# Patient Record
Sex: Female | Born: 1970 | Race: White | Hispanic: No | Marital: Married | Smoking: Never smoker
Health system: Southern US, Community
[De-identification: ages and names within clinical notes are randomized; demographics above are authoritative.]

## PROBLEM LIST (undated history)

## (undated) DIAGNOSIS — H8110 Benign paroxysmal vertigo, unspecified ear: Secondary | ICD-10-CM

## (undated) DIAGNOSIS — G2581 Restless legs syndrome: Secondary | ICD-10-CM

## (undated) DIAGNOSIS — E042 Nontoxic multinodular goiter: Secondary | ICD-10-CM

## (undated) HISTORY — DX: Benign paroxysmal vertigo, unspecified ear: H81.10

## (undated) HISTORY — DX: Nontoxic multinodular goiter: E04.2

## (undated) HISTORY — DX: Hemochromatosis, unspecified: E83.119

## (undated) HISTORY — DX: Restless legs syndrome: G25.81

## (undated) HISTORY — PX: TUBAL LIGATION: SHX77

---

## 2003-11-18 HISTORY — PX: INGUINAL HERNIA REPAIR: SUR1180

## 2013-07-29 ENCOUNTER — Ambulatory Visit (INDEPENDENT_AMBULATORY_CARE_PROVIDER_SITE_OTHER): Payer: BC Managed Care – PPO | Admitting: Internal Medicine

## 2013-07-29 ENCOUNTER — Other Ambulatory Visit: Payer: Self-pay | Admitting: Internal Medicine

## 2013-07-29 ENCOUNTER — Encounter: Payer: Self-pay | Admitting: Internal Medicine

## 2013-07-29 VITALS — BP 118/80 | HR 69 | Temp 98.7°F | Resp 10 | Ht 71.0 in | Wt 185.0 lb

## 2013-07-29 DIAGNOSIS — R946 Abnormal results of thyroid function studies: Secondary | ICD-10-CM

## 2013-07-29 DIAGNOSIS — E049 Nontoxic goiter, unspecified: Secondary | ICD-10-CM

## 2013-07-29 DIAGNOSIS — E161 Other hypoglycemia: Secondary | ICD-10-CM

## 2013-07-29 DIAGNOSIS — E162 Hypoglycemia, unspecified: Secondary | ICD-10-CM

## 2013-07-29 DIAGNOSIS — E041 Nontoxic single thyroid nodule: Secondary | ICD-10-CM

## 2013-07-29 LAB — T4, FREE: Free T4: 0.9 ng/dL (ref 0.60–1.60)

## 2013-07-29 LAB — COMPREHENSIVE METABOLIC PANEL
Alkaline Phosphatase: 41 U/L (ref 39–117)
Creatinine, Ser: 0.7 mg/dL (ref 0.4–1.2)
Glucose, Bld: 95 mg/dL (ref 70–99)
Sodium: 139 mEq/L (ref 135–145)
Total Bilirubin: 0.7 mg/dL (ref 0.3–1.2)
Total Protein: 6.9 g/dL (ref 6.0–8.3)

## 2013-07-29 LAB — GLUCOSE, POCT (MANUAL RESULT ENTRY): POC Glucose: 105 mg/dl — AB (ref 70–99)

## 2013-07-29 LAB — T3, FREE: T3, Free: 2.9 pg/mL (ref 2.3–4.2)

## 2013-07-29 LAB — HEMOGLOBIN A1C: Hgb A1c MFr Bld: 5.4 % (ref 4.6–6.5)

## 2013-07-29 NOTE — Progress Notes (Addendum)
Subjective:     Patient ID: Felicia Campos, female   DOB: [birth year], 42 y.o.   MRN: [id number]  HPI [name] is a pleasant 42 y/o woman, self referred for evaluation for hypoglycemic symptoms without the diagnosis of diabetes or confirmed hypoglycemia. I have no records in our system that I could review.   She has sxs of hypoglycemia since few year ago, worse in the last year. - sweating - tremors - memory loss - has to eat a lot when this happens >> takes 3h to feel better - weight gain 20 lbs in 1 year - no HAs - sugars make her feel better - sxs worse in am, mostly after eating - coffee makes it worse - no change with 1 glass red wine every day - no connection with exercise. She exercises by walking daily.  Meals: - Breakfast:eggs x 2, sausage, decaf coffee - Lunch: salads or cheeseburger - Dinner: salad or chicken, vegetables -low carbohydrate - Snacks: 2-3 a day: cheese, crackers, protein bars She tells me that she did feel better when she ate a low carb diet.  She did check blood sugars during one of these episodes: 110.  A year ago, she had borderline hypothyroidism >> not tx. A year later it was borderline hyperthyroidism, but she was told there is NTD. She did not follow with her PCP in [place].  No FH of DM, preDM, hypothyr. Mother has Raynaud's sd.  She does not take any medicines, no PMH other than anemia >> was told to take a MVI but she takes a B12 vitamin, instead.   Review of Systems Constitutional: see HPI Eyes: no blurry vision, no xerophthalmia ENT: no sore throat, no nodules palpated in throat, no dysphagia/odynophagia, no hoarseness Cardiovascular: no CP/SOB/palpitations/leg swelling Respiratory: no cough/SOB Gastrointestinal: no N/V/D/C Musculoskeletal: no muscle/joint aches Skin: no rashes Neurological: no tremors/numbness/tingling/dizziness Psychiatric: no depression/anxiety  PMH - anemia - h/o abnormal thyrodid tests  Past Surgical History   Procedure Laterality Date  . Tubal ligation    . Inguinal hernia repair  2005   History   Social History  . 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 Smoking status: Never Smoker   . Smokeless tobacco: Never Used  . Alcohol Use: 3.5 oz/week    7 drink(s) per week  . Drug Use: No  . Sexual Activity: Yes    Partners: Male   Social History Narrative   Regular exercise: walk; 20 min daily   Caffeine use: 1 soda daily   No current outpatient prescriptions on file prior to visit.   No current facility-administered medications on file prior to visit.   She only takes a B12 vit daily.   Family History  Problem Relation Age of Onset  . Stroke Mother     1  . Heart disease Maternal Grandmother   . Cancer Paternal Grandmother     breast cancer   Objective:   Physical Exam BP 118/80  Pulse 69  Temp(Src) 98.7 F (37.1 C) (Oral)  Resp 10  Ht 5\' 11"  (1.803 m)  Wt 185 lb (83.915 kg)  BMI 25.81 kg/m2  SpO2 98% Constitutional: normal weight, in NAD Eyes: PERRLA, EOMI, no exophthalmos ENT: moist mucous membranes, + thyromegaly - ? L sided nodule, no cervical lymphadenopathy Cardiovascular:  RRR, No MRG Respiratory: CTA B Gastrointestinal: abdomen soft, NT, ND, BS+ Musculoskeletal: no deformities, strength intact in all 4 Skin: moist, warm, no rashes Neurological: no tremor with outstretched hands, DTR normal in all 4  Assessment:     1. Hypoglycemia sxs without hypoglycemia  2. Abnormal thyroid tests in the past  3. Goiter    Plan:     1. Hypoglycemia sxs without hypoglycemia - patient has some symptoms and might be suggestive for hypoglycemia, however when she checked her sugars during these episodes, the sugar was normal - she is not taking medicines or supplements that can cause low blood sugars - CBG at the time of the  appointment 105  - we did discuss about possible causes for hypoglycemia, usually an insulin-glucose mismatch as in the case of insulin resistance/prediabetes, or the use of high glycemic index foods that can cause a "sugar crash" even in patients without insulin resistance. I gave her a list with fluids and there glycemic index, and advised her to avoid a high glycemic index foods. - I advised her to continue to check her sugars during these episodes - We'll check a hemoglobin A1c and CMP today - I would also add a vitamin D level  2. Abnormal thyroid tests in the past - I will repeat her TSH, free T4, free T3  3. Goiter - I feel that her thyroid is enlarged by palpation, and also she might have a left-sided thyroid nodule. I suggested that she has a thyroid ultrasound to make sure that the findings are not caused by neck fat tissue distribution. She agrees with this and I ordered the ultrasound.  I advised her to try my chart for easier communication.   Office Visit on 07/29/2013  Component Date Value Range Status  . Hemoglobin A1C 07/29/2013 5.4  4.6 - 6.5 % Final   Glycemic Control Guidelines for People with Diabetes:Non Diabetic:  <6%Goal of Therapy: <7%Additional Action Suggested:  >8%   . Sodium 07/29/2013 139  135 - 145 mEq/L Final  . Potassium 07/29/2013 4.1  3.5 - 5.1 mEq/L Final  . Chloride 07/29/2013 108  96 - 112 mEq/L Final  . CO2 07/29/2013 27  19 - 32 mEq/L Final  . Glucose, Bld 07/29/2013 95  70 - 99 mg/dL Final  . BUN 46/96/2952 15  6 - 23 mg/dL Final  . Creatinine, Ser 07/29/2013 0.7  0.4 - 1.2 mg/dL Final  . Total Bilirubin 07/29/2013 0.7  0.3 - 1.2 mg/dL Final  . Alkaline Phosphatase 07/29/2013 41  39 - 117 U/L Final  . AST 07/29/2013 11  0 - 37 U/L Final  . ALT 07/29/2013 11  0 - 35 U/L Final  . Total Protein 07/29/2013 6.9  6.0 - 8.3 g/dL Final  . Albumin 84/13/2440 4.2  3.5 - 5.2 g/dL Final  . Calcium 09/13/2535 9.0  8.4 - 10.5 mg/dL Final  . GFR 64/40/3474  93.56  >60.00 mL/min Final  . TSH 07/29/2013 0.65  0.35 - 5.50 uIU/mL Final  . Free T4 07/29/2013 0.90  0.60 - 1.60 ng/dL Final  . T3, Free 25/95/6387 2.9  2.3 - 4.2 pg/mL Final  . POC Glucose 07/29/2013 105* 70 - 99 mg/dl Final   done in office   Vit D level 65.  Msg sent; Dear Ms Izora Ribas, All labs are normal, no diabetes or prediabetes, no vitamin D deficiency, kidney and liver functions are normal, and all the thyroid tests are normal, as well. Please let me  know if you have any questions. Sincerely, Carlus Pavlov MD  Thyroid U/S: Ultrasound examination of the thyroid gland and adjacent soft tissues was performed. COMPARISON: None. FINDINGS: Right thyroid lobe Measurements: 4.3 x 1.7 x 2.1 cm. 1.3 cm complex predominantly cystic nodule in the midpole posteriorly. Other smaller scattered subcentimeric nodules. Left thyroid lobe Measurements: 5.0 x 2.1 x 2.1 cm. Inhomogeneous, isoechoic solid nodule in the mid to lower pole measuring 4.5 x 3.1 x 1.7 cm. Isthmus Thickness: 3 mm. No nodules visualized. Lymphadenopathy None visualized.  IMPRESSION: Bilateral thyroid nodules. The dominant finding is an isoechoic solid mass in the mid to lower pole of the left thyroid lobe. Findings meet consensus criteria for biopsy. Ultrasound-guided fine needle aspiration should be considered, as per the consensus statement: Management of Thyroid Nodules Detected at [place]: Society of Radiologists in Ultrasound Consensus Conference Statement. Radiology 2005; [id number].  Electronically Signed By: Charlett Nose M.D. On: 08/03/2013 10:47  Reviewing the images of the thyroid ultrasound above, the right thyroid 1.3 cm lesion is mostly cystic, and very low risk for cancer. However, the left isoechoic mass is very large and I would recommend biopsy. I will let patient know.  08/08/2013 (Monday): Pt was informed about the above results today, 3 working days after I received the results (I was in  vacation last week). She was very upset about the delay. She did want to go ahead with the Bx. Will order.  I wrote her a message apologizing for the delay. I explained that this is not an emergency, and several days will not influence the result/prognosis in any way.  08/18/2013: FNA result: Adequacy Reason Satisfactory For Evaluation. Diagnosis THYROID, FINE NEEDLE ASPIRATION, LEFT (SPECIMEN 1 OF 1, COLLECTED 08/16/13): BENIGN. FINDINGS CONSISTENT WITH NON-NEOPLASTIC GOITER. [place] RUND DO Pathologist, Electronic Signature  Sent pt msg about the benign results and the need for f/u in 1 year.

## 2013-07-29 NOTE — Patient Instructions (Addendum)
Please join MyChart. I will send you the results through there. You will be called with the thyroid ultrasound appointment. Continue to check sugars when you feel poorly >> let me know if they are <60. Please return in 6 months.  Consider the glycemic index of foods that you eat or blend (higher glycemic index = higher risk to increase your sugars): [email or web address]

## 2013-07-30 LAB — VITAMIN D 25 HYDROXY (VIT D DEFICIENCY, FRACTURES): Vit D, 25-Hydroxy: 65 ng/mL (ref 30–89)

## 2013-08-01 ENCOUNTER — Encounter: Payer: Self-pay | Admitting: Internal Medicine

## 2013-08-02 ENCOUNTER — Encounter: Payer: Self-pay | Admitting: Internal Medicine

## 2013-08-02 LAB — COMPREHENSIVE METABOLIC PANEL
ALT: 8 U/L (ref 0–35)
AST: 11 U/L (ref 0–37)
Alkaline Phosphatase: 45 U/L (ref 39–117)
Chloride: 105 mEq/L (ref 96–112)
Creat: 0.86 mg/dL (ref 0.50–1.10)
Total Bilirubin: 0.3 mg/dL (ref 0.3–1.2)

## 2013-08-03 ENCOUNTER — Ambulatory Visit (HOSPITAL_COMMUNITY)
Admission: RE | Admit: 2013-08-03 | Discharge: 2013-08-03 | Disposition: A | Discharge status: Home / Self Care | Payer: BC Managed Care – PPO | Source: Ambulatory Visit | Attending: Internal Medicine | Admitting: Internal Medicine

## 2013-08-03 ENCOUNTER — Encounter: Payer: Self-pay | Admitting: Internal Medicine

## 2013-08-03 DIAGNOSIS — E049 Nontoxic goiter, unspecified: Secondary | ICD-10-CM | POA: Insufficient documentation

## 2013-08-04 ENCOUNTER — Encounter: Payer: Self-pay | Admitting: Internal Medicine

## 2013-08-08 ENCOUNTER — Encounter: Payer: Self-pay | Admitting: Internal Medicine

## 2013-08-08 ENCOUNTER — Telehealth: Payer: Self-pay | Admitting: *Deleted

## 2013-08-08 NOTE — Telephone Encounter (Signed)
Pt called asking to set up an appt for a biopsy due to her U/S. Please advise.

## 2013-08-08 NOTE — Telephone Encounter (Signed)
I faxed most recent lab results and copy of her U/S to [facility] at [facility].

## 2013-08-08 NOTE — Telephone Encounter (Signed)
I will let her know I ordered it.

## 2013-08-16 ENCOUNTER — Ambulatory Visit
Admission: RE | Admit: 2013-08-16 | Discharge: 2013-08-16 | Disposition: A | Discharge status: Home / Self Care | Payer: BC Managed Care – PPO | Source: Ambulatory Visit | Attending: Internal Medicine | Admitting: Internal Medicine

## 2013-08-16 ENCOUNTER — Other Ambulatory Visit (HOSPITAL_COMMUNITY)
Admission: RE | Admit: 2013-08-16 | Discharge: 2013-08-16 | Disposition: A | Discharge status: Home / Self Care | Payer: BC Managed Care – PPO | Source: Ambulatory Visit | Attending: Interventional Radiology | Admitting: Interventional Radiology

## 2013-08-16 DIAGNOSIS — E041 Nontoxic single thyroid nodule: Secondary | ICD-10-CM

## 2013-09-22 ENCOUNTER — Other Ambulatory Visit: Payer: Self-pay

## 2015-08-03 IMAGING — US US SOFT TISSUE HEAD/NECK
1 series · 14 of 25 positions shown · non-contrast
Comparison: None.

CLINICAL DATA: Left thyroid nodule, goiter.

EXAM:
THYROID ULTRASOUND
TECHNIQUE: Ultrasound examination of the thyroid gland and adjacent soft
tissues was performed.

[Series 1: us soft tissue head/neck · 0.05mm/px · 14 of 43 slices shown]
[im 1/43]
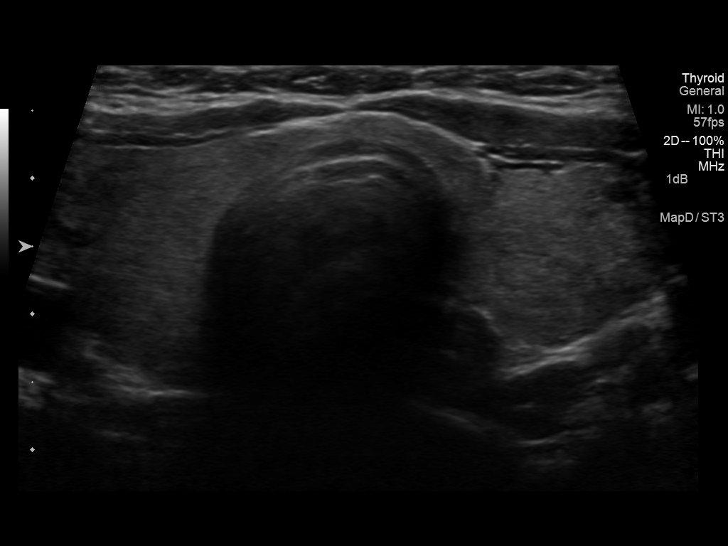
[im 4/43]
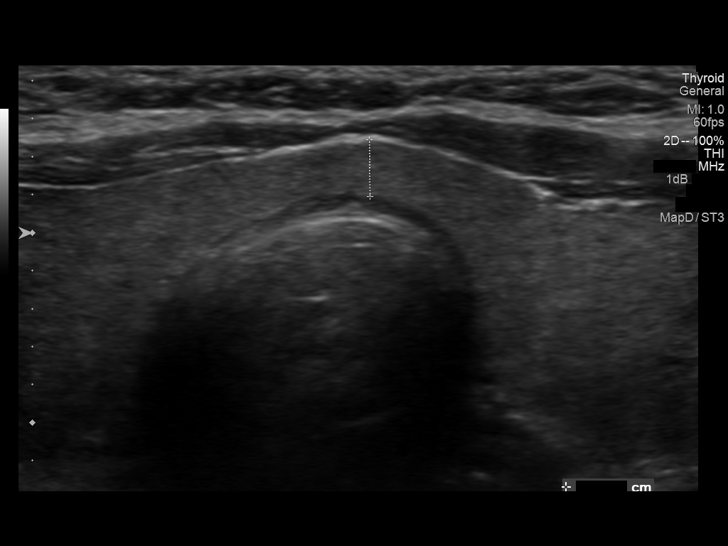
[im 8/43]
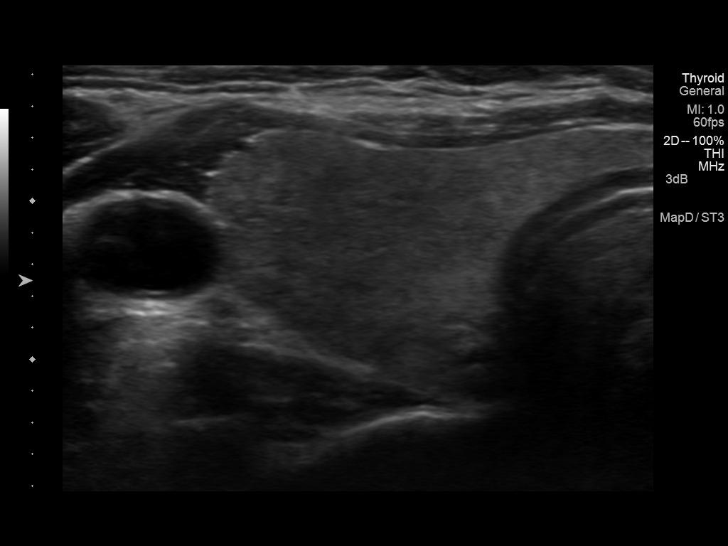
[im 11/43]
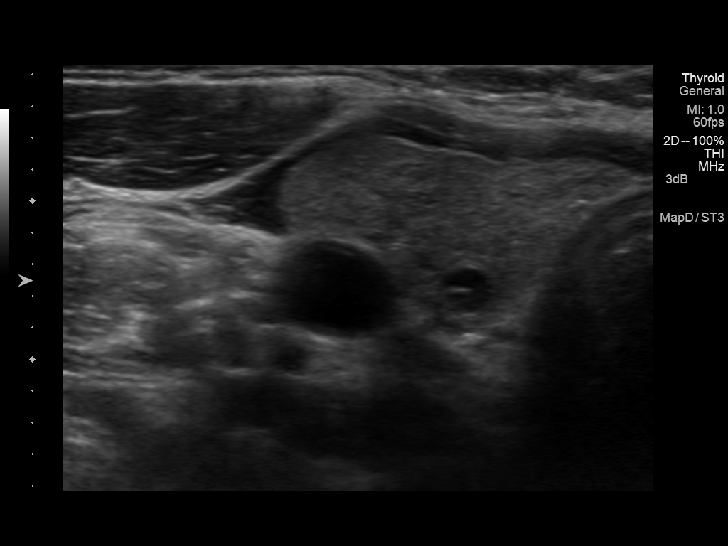
[im 15/43]
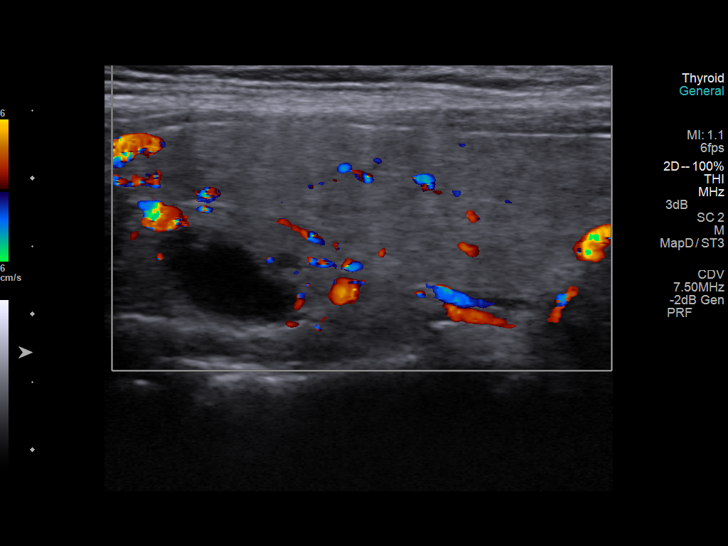
[im 16/43]
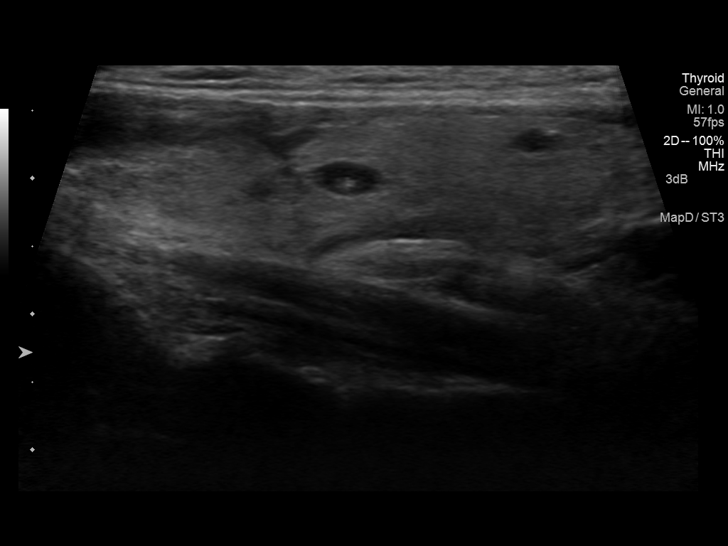
[im 20/43]
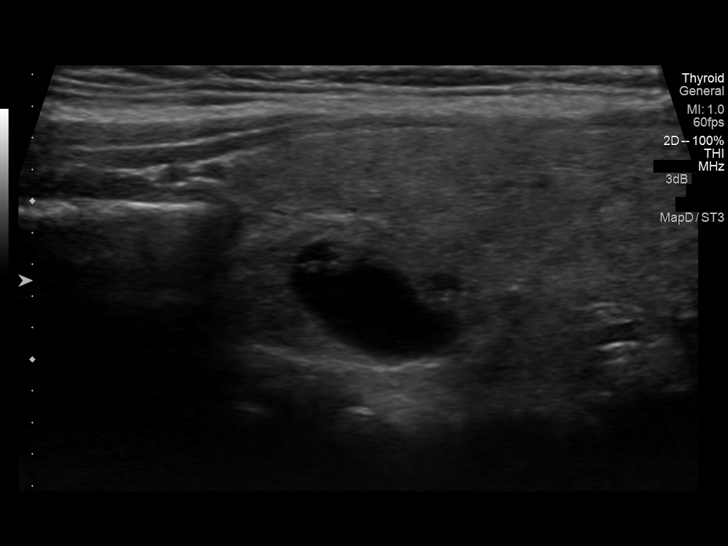
[im 23/43]
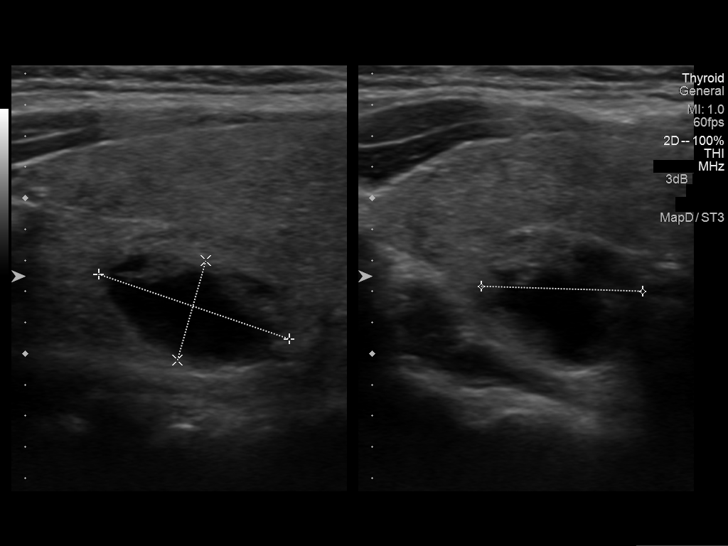
[im 27/43]
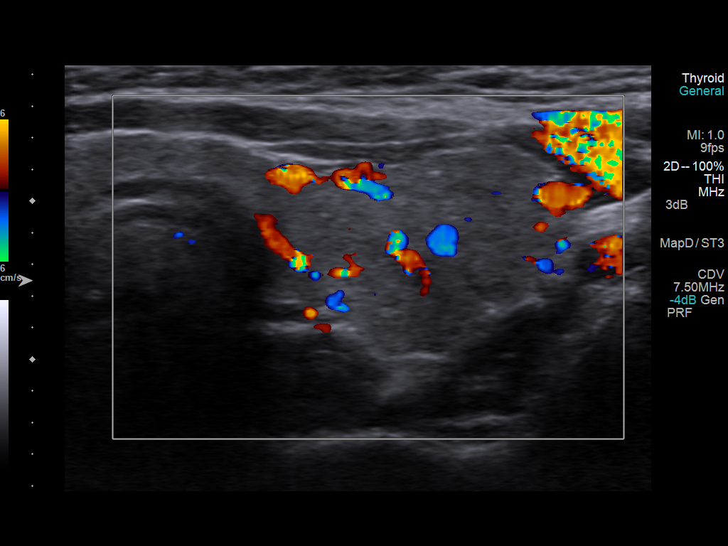
[im 29/43]
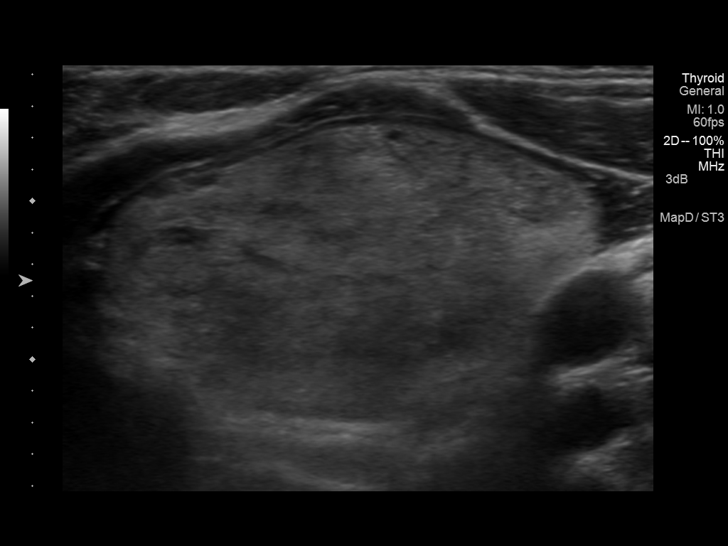
[im 32/43]
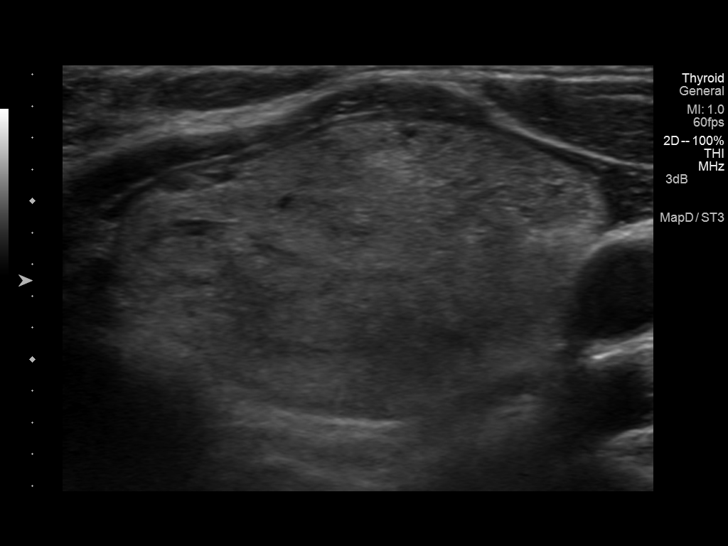
[im 36/43]
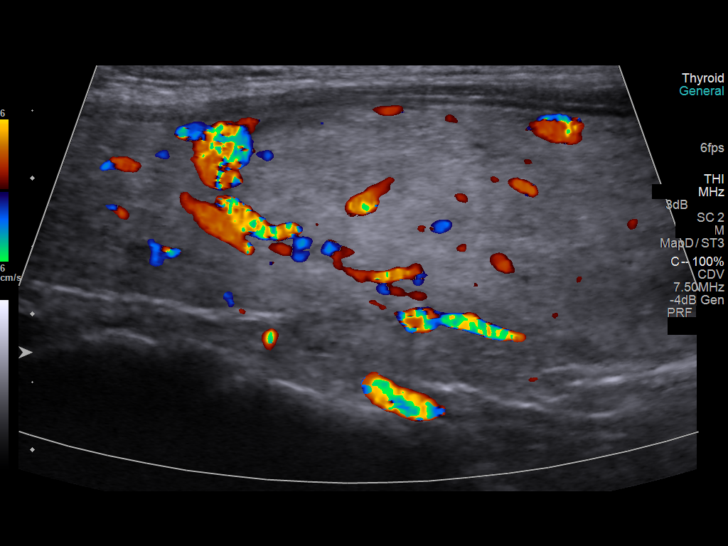
[im 39/43]
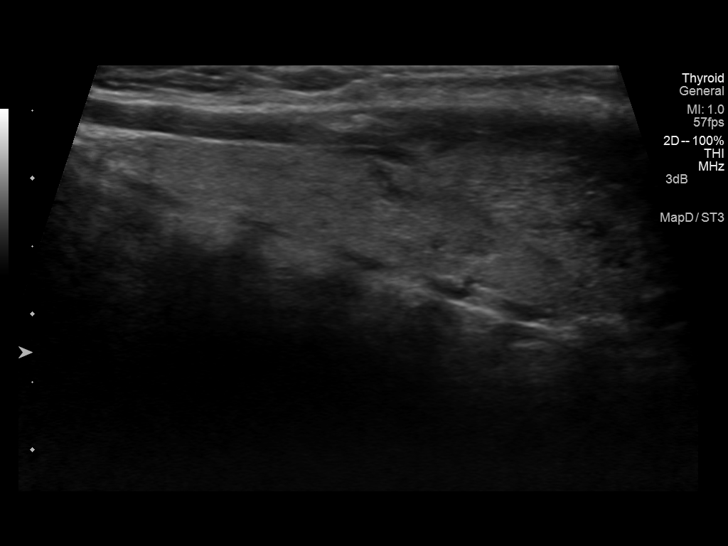
[im 43/43]
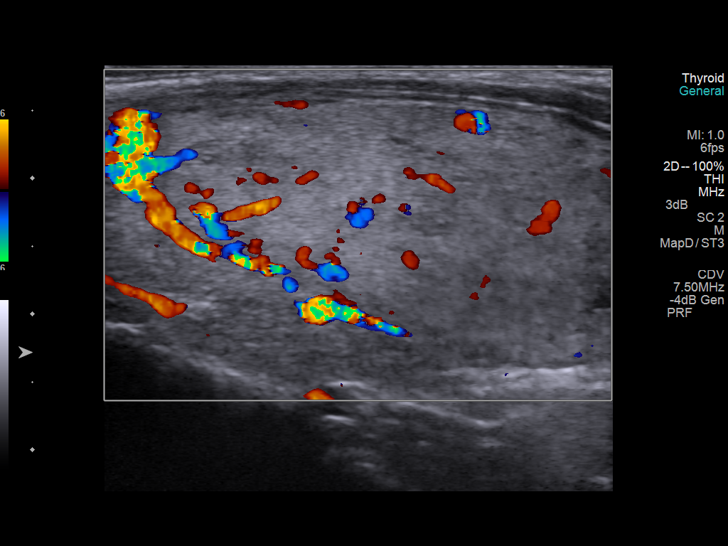

[14 of 25 positions shown; findings below may reference images not displayed]

FINDINGS: Right thyroid lobe

Measurements: 4.3 x 1.7 x 2.1 cm. 1.3 cm complex predominantly
cystic nodule in the midpole posteriorly. Other smaller scattered
subcentimeric nodules.

Left thyroid lobe

Measurements: 5.0 x 2.1 x 2.1 cm. Inhomogeneous, isoechoic solid
nodule in the mid to lower pole measuring 4.5 x 3.1 x 1.7 cm.

Isthmus

Thickness: 3 mm.  No nodules visualized.

Lymphadenopathy

None visualized.
IMPRESSION: Bilateral thyroid nodules. The dominant finding is an isoechoic
solid mass in the mid to lower pole of the left thyroid lobe.
Findings meet consensus criteria for biopsy. Ultrasound-guided fine
needle aspiration should be considered, as per the consensus
statement: Management of Thyroid Nodules Detected at US: Society of
Radiologists in Ultrasound Consensus Conference Statement. Radiology

## 2015-08-30 ENCOUNTER — Encounter: Payer: Self-pay | Admitting: Family

## 2015-08-30 ENCOUNTER — Ambulatory Visit (INDEPENDENT_AMBULATORY_CARE_PROVIDER_SITE_OTHER): Payer: PRIVATE HEALTH INSURANCE | Admitting: Family

## 2015-08-30 VITALS — BP 130/89 | HR 82 | Temp 97.4°F | Ht 71.0 in | Wt 165.2 lb

## 2015-08-30 DIAGNOSIS — B3731 Acute candidiasis of vulva and vagina: Secondary | ICD-10-CM

## 2015-08-30 DIAGNOSIS — N760 Acute vaginitis: Secondary | ICD-10-CM

## 2015-08-30 DIAGNOSIS — B373 Candidiasis of vulva and vagina: Secondary | ICD-10-CM | POA: Diagnosis not present

## 2015-08-30 DIAGNOSIS — A499 Bacterial infection, unspecified: Secondary | ICD-10-CM

## 2015-08-30 DIAGNOSIS — B9689 Other specified bacterial agents as the cause of diseases classified elsewhere: Secondary | ICD-10-CM

## 2015-08-30 LAB — POCT URINALYSIS DIPSTICK
BILIRUBIN UA: NEGATIVE
Blood, UA: NEGATIVE
GLUCOSE UA: NEGATIVE
Ketones, UA: NEGATIVE
LEUKOCYTES UA: NEGATIVE
NITRITE UA: NEGATIVE
PH UA: 7
PROTEIN UA: NEGATIVE
Spec Grav, UA: <=1.005
Urobilinogen, UA: NEGATIVE

## 2015-08-30 LAB — POCT WET PREP (WET MOUNT)

## 2015-08-30 LAB — POCT UA - MICROSCOPIC ONLY
Casts, Ur, LPF, POC: NEGATIVE
Crystals, Ur, HPF, POC: NEGATIVE
Mucus, UA: NEGATIVE
RBC, URINE, MICROSCOPIC: NEGATIVE
WBC, Ur, HPF, POC: NEGATIVE
Yeast, UA: NEGATIVE

## 2015-08-30 MED ORDER — METRONIDAZOLE 500 MG PO TABS: 500.0000 mg | ORAL_TABLET | Freq: Two times a day (BID) | ORAL | Status: DC

## 2015-08-30 NOTE — Patient Instructions (Signed)

## 2015-08-30 NOTE — Progress Notes (Signed)
   Subjective:    Patient ID: Felicia Campos, female    DOB: 1971/06/18, 44 y.o.   MRN: [id number]  Vaginal Itching The patient's primary symptoms include genital itching, a genital rash and vaginal discharge. The patient's pertinent negatives include no genital odor. This is a new problem. The current episode started in the past 7 days. The problem occurs constantly. The problem has been unchanged. The pain is mild. The problem affects both sides. Associated symptoms include painful intercourse. Pertinent negatives include no abdominal pain, chills, constipation, diarrhea, discolored urine, fever, flank pain, frequency, headaches, hematuria, nausea, rash, urgency or vomiting. The vaginal discharge was white. She has tried nothing for the symptoms. The treatment provided no relief.      Review of Systems  Constitutional: Negative.  Negative for fever and chills.  HENT: Negative.   Eyes: Negative.   Respiratory: Negative.  Negative for shortness of breath.   Cardiovascular: Negative.  Negative for palpitations.  Gastrointestinal: Negative.  Negative for nausea, vomiting, abdominal pain, diarrhea and constipation.  Endocrine: Negative.   Genitourinary: Positive for vaginal discharge. Negative for urgency, frequency, hematuria and flank pain.  Musculoskeletal: Negative.   Skin: Negative for rash.  Neurological: Negative.  Negative for headaches.  Hematological: Negative.   Psychiatric/Behavioral: Negative.   All other systems reviewed and are negative.      Objective:   Physical Exam  Constitutional: She is oriented to person, place, and time. She appears well-developed and well-nourished. No distress.  HENT:  Head: Normocephalic and atraumatic.  Right Ear: External ear normal.  Left Ear: External ear normal.  Nose: Nose normal.  Mouth/Throat: Oropharynx is clear and moist.  Eyes: Pupils are equal, round, and reactive to light.  Neck: Normal range of motion. Neck supple. No  thyromegaly present.  Cardiovascular: Normal rate, regular rhythm, normal heart sounds and intact distal pulses.   No murmur heard. Pulmonary/Chest: Effort normal and breath sounds normal. No respiratory distress. She has no wheezes.  Abdominal: Soft. Bowel sounds are normal. She exhibits no distension. There is no tenderness.  Musculoskeletal: Normal range of motion. She exhibits no edema or tenderness.  Neurological: She is alert and oriented to person, place, and time. She has normal reflexes. No cranial nerve deficit.  Skin: Skin is warm and dry.  Psychiatric: She has a normal mood and affect. Her behavior is normal. Judgment and thought content normal.  Vitals reviewed.   BP 130/89 mmHg  Pulse 82  Temp(Src) 97.4 F (36.3 C) (Oral)  Ht 5\' 11"  (1.803 m)  Wt 165 lb 3.2 oz (74.934 kg)  BMI 23.05 kg/m2  LMP 08/27/2015       Assessment & Plan:  1. Yeast vaginitis - POCT urinalysis dipstick - POCT UA - Microscopic Only - POCT Wet Prep [facility])  2. BV (bacterial vaginosis) -Keep clean and dry -Cotton underwear -No sex until infection is cleared -RTO prn - metroNIDAZOLE (FLAGYL) 500 MG tablet; Take 1 tablet (500 mg total) by mouth 2 (two) times daily.  Dispense: 14 tablet; Refill: 0  Evelina Dun, FNP

## 2015-08-31 ENCOUNTER — Telehealth: Payer: Self-pay | Admitting: Family

## 2015-08-31 MED ORDER — BUTOCONAZOLE NITRATE (1 DOSE) 2 % VA CREA: 1.0000 "application " | TOPICAL_CREAM | Freq: Every day | VAGINAL | Status: DC

## 2015-08-31 NOTE — Telephone Encounter (Signed)
Prescription sent to pharmacy.

## 2015-09-03 ENCOUNTER — Telehealth: Payer: Self-pay | Admitting: Family

## 2015-09-03 MED ORDER — SULFAMETHOXAZOLE-TRIMETHOPRIM 800-160 MG PO TABS: 1.0000 | ORAL_TABLET | Freq: Two times a day (BID) | ORAL | Status: DC

## 2015-09-03 MED ORDER — FLUCONAZOLE 150 MG PO TABS: 150.0000 mg | ORAL_TABLET | ORAL | Status: DC

## 2015-09-03 NOTE — Telephone Encounter (Signed)
Antibiotic Prescription sent to pharmacy  And Diflucan rx sent to pharmacy. If symptoms continue pt will need to be seen again

## 2015-09-03 NOTE — Telephone Encounter (Signed)
Patient was seen last week for bacterial vaginosis and she still having severe vaginal itching burning that has increased, nauseated, no appetite, plus all original symptoms. Please advise.

## 2015-09-03 NOTE — Telephone Encounter (Signed)
Patient aware.

## 2015-10-12 ENCOUNTER — Telehealth: Payer: Self-pay | Admitting: Family

## 2015-10-12 NOTE — Telephone Encounter (Signed)
Detailed message left for patient that she will need to be seen  

## 2015-10-12 NOTE — Telephone Encounter (Signed)
Unfortunately she will have to be seen prior to prescribing another antibiotic. Caryl Pina, MD [facility] 10/12/2015, 4:22 PM

## 2015-10-12 NOTE — Telephone Encounter (Signed)
Patient wants to know if you will feel the bactrim for BV. She states that she can not get off to come in. 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

## 2015-10-15 ENCOUNTER — Encounter: Payer: Self-pay | Admitting: Family

## 2015-10-15 ENCOUNTER — Ambulatory Visit (INDEPENDENT_AMBULATORY_CARE_PROVIDER_SITE_OTHER): Payer: PRIVATE HEALTH INSURANCE | Admitting: Family

## 2015-10-15 VITALS — BP 131/87 | HR 70 | Temp 97.8°F | Ht 71.0 in | Wt 173.4 lb

## 2015-10-15 DIAGNOSIS — N898 Other specified noninflammatory disorders of vagina: Secondary | ICD-10-CM

## 2015-10-15 DIAGNOSIS — B3731 Acute candidiasis of vulva and vagina: Secondary | ICD-10-CM

## 2015-10-15 DIAGNOSIS — B373 Candidiasis of vulva and vagina: Secondary | ICD-10-CM | POA: Diagnosis not present

## 2015-10-15 LAB — POCT WET PREP (WET MOUNT): KOH WET PREP POC: POSITIVE

## 2015-10-15 MED ORDER — FLUCONAZOLE 150 MG PO TABS: 150.0000 mg | ORAL_TABLET | ORAL | Status: DC

## 2015-10-15 MED ORDER — BUTOCONAZOLE NITRATE (1 DOSE) 2 % VA CREA: 1.0000 "application " | TOPICAL_CREAM | Freq: Every day | VAGINAL | Status: DC

## 2015-10-15 NOTE — Patient Instructions (Signed)

## 2015-10-15 NOTE — Progress Notes (Signed)
   Subjective:    Patient ID: Felicia Campos, female    DOB: 1971/04/24, 44 y.o.   MRN: [id number]  Vaginal Itching The patient's primary symptoms include genital itching and vaginal discharge. This is a new problem. The current episode started in the past 7 days. The problem occurs intermittently. The problem has been waxing and waning. The patient is experiencing no pain. Pertinent negatives include no constipation, diarrhea, discolored urine, flank pain, frequency, headaches, sore throat or urgency. The vaginal discharge was clear. There has been no bleeding. She has tried antibiotics and antifungals for the symptoms. The treatment provided moderate relief.      Review of Systems  Constitutional: Negative.   HENT: Negative.  Negative for sore throat.   Eyes: Negative.   Respiratory: Negative.  Negative for shortness of breath.   Cardiovascular: Negative.  Negative for palpitations.  Gastrointestinal: Negative.  Negative for diarrhea and constipation.  Endocrine: Negative.   Genitourinary: Positive for vaginal discharge. Negative for urgency, frequency and flank pain.  Musculoskeletal: Negative.   Neurological: Negative.  Negative for headaches.  Hematological: Negative.   Psychiatric/Behavioral: Negative.   All other systems reviewed and are negative.      Objective:   Physical Exam  Constitutional: She is oriented to person, place, and time. She appears well-developed and well-nourished. No distress.  HENT:  Head: Normocephalic and atraumatic.  Eyes: Pupils are equal, round, and reactive to light.  Neck: Normal range of motion. Neck supple. No thyromegaly present.  Cardiovascular: Normal rate, regular rhythm, normal heart sounds and intact distal pulses.   No murmur heard. Pulmonary/Chest: Effort normal and breath sounds normal. No respiratory distress. She has no wheezes.  Abdominal: Soft. Bowel sounds are normal. She exhibits no distension. There is no tenderness.    Musculoskeletal: Normal range of motion. She exhibits no edema or tenderness.  Neurological: She is alert and oriented to person, place, and time. She has normal reflexes. No cranial nerve deficit.  Skin: Skin is warm and dry.  Psychiatric: She has a normal mood and affect. Her behavior is normal. Judgment and thought content normal.  Vitals reviewed.   BP 131/87 mmHg  Pulse 70  Temp(Src) 97.8 F (36.6 C) (Oral)  Ht 5\' 11"  (1.803 m)  Wt 173 lb 6.4 oz (78.654 kg)  BMI 24.20 kg/m2       Assessment & Plan:  1. Vaginal discharge - POCT Wet Prep [facility])  2. Vagina, candidiasis -Keep clean and dry -Cotton underwear -RTO prn  - fluconazole (DIFLUCAN) 150 MG tablet; Take 1 tablet (150 mg total) by mouth every 3 (three) days.  Dispense: 3 tablet; Refill: 0 - Butoconazole Nitrate, 1 Dose, 2 % CREA; Place 1 application vaginally daily.  Dispense: 5.8 g; Refill: [place], FNP

## 2016-01-15 ENCOUNTER — Telehealth: Payer: Self-pay | Admitting: Family

## 2017-06-11 DIAGNOSIS — E042 Nontoxic multinodular goiter: Secondary | ICD-10-CM | POA: Insufficient documentation

## 2017-10-20 DIAGNOSIS — E611 Iron deficiency: Secondary | ICD-10-CM | POA: Insufficient documentation

## 2018-10-26 ENCOUNTER — Telehealth: Payer: Self-pay

## 2018-10-26 NOTE — Telephone Encounter (Signed)
FYI  Copied from [place] [phone]. Topic: Appointment Scheduling - New Patient >> Oct 26, 2018  3:03 PM Alanda Slim E wrote: New patient has been scheduled for your office. Provider: Letta Median , DO Date of Appointment: 01.03.2020 @ 9am

## 2018-11-18 ENCOUNTER — Encounter: Payer: Self-pay | Admitting: Family Medicine

## 2018-11-18 ENCOUNTER — Ambulatory Visit (INDEPENDENT_AMBULATORY_CARE_PROVIDER_SITE_OTHER): Payer: No Typology Code available for payment source | Admitting: Family Medicine

## 2018-11-18 VITALS — BP 120/90 | HR 81 | Temp 97.9°F | Ht 71.0 in | Wt 190.8 lb

## 2018-11-18 DIAGNOSIS — R946 Abnormal results of thyroid function studies: Secondary | ICD-10-CM | POA: Diagnosis not present

## 2018-11-18 DIAGNOSIS — E049 Nontoxic goiter, unspecified: Secondary | ICD-10-CM

## 2018-11-18 DIAGNOSIS — Z7689 Persons encountering health services in other specified circumstances: Secondary | ICD-10-CM

## 2018-11-18 LAB — T4, FREE: Free T4: 0.99 ng/dL (ref 0.60–1.60)

## 2018-11-18 LAB — TSH: TSH: 0.37 u[IU]/mL (ref 0.35–4.50)

## 2018-11-18 NOTE — Progress Notes (Signed)
Felicia Campos is a 48 y.o. female  Chief Complaint  Patient presents with  . Establish Care    here to establish care, thyroid issues, cyst on thyroid , fatigue    HPI: Felicia Campos is a 48 y.o. female here to establish care with our office.  She notes a h/o "extensive thyroid issues" including thyroid "cysts" and "goiter". Her care and work-up was at [facility] and records available in [place].  She has had [place], biopsy of thyroid. She had alcohol ablation about 1 year ago but was told cyst would come back. Patient states it has come back and is larger than previously. She now notes trouble swallowing. She has stopped taking daily MVI d/t size and difficulty swallowing. Pt states she was not on any thyroid medication in the past.   Pt complains of "feeling sluggish", fatigued. No hot/cold intolerance. No fever, chills. No dizziness. No sleep issues. Dry skin. 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  She is due for CPE, fasting labs, PAP, mammo.  No past medical history on file.  Past Surgical History:  Procedure Laterality Date  . INGUINAL HERNIA REPAIR  2005  . TUBAL LIGATION      Social History   Socioeconomic History  . Marital status: Married    Spouse name: Not on file  . Number of children: Not on file  . Years of education: Not on file  . Highest education level: Not on file  Occupational History  . 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 Financial resource strain: Not on file  . Food insecurity:    Worry: Not on file    Inability: Not on file  . Transportation needs:    Medical: Not on file    Non-medical: Not on file  Tobacco Use  . Smoking status: Never Smoker  . Smokeless tobacco: Never Used  Substance and Sexual Activity  . Alcohol use: Yes    Alcohol/week: 7.0 standard drinks    Types: 7 drink(s) per week  . Drug use: No  . Sexual activity: Yes    Partners: Male  Lifestyle  . Physical activity:   Days per week: Not on file    Minutes per session: Not on file  . Stress: Not on file  Relationships  . Social connections:    Talks on phone: Not on file    Gets together: Not on file    Attends religious service: Not on file    Active member of club or organization: Not on file    Attends meetings of clubs or organizations: Not on file    Relationship status: Not on file  . Intimate partner violence:    Fear of current or ex partner: Not on file    Emotionally abused: Not on file    Physically abused: Not on file    Forced sexual activity: Not on file  Other Topics Concern  . Not on file  Social History Narrative   Regular exercise: walk; 20 min daily   Caffeine use: 1 soda daily    Family History  Problem Relation Age of Onset  . Stroke Mother        20  . Heart disease Maternal Grandmother   . Cancer Paternal Grandmother        breast cancer     Immunization History  Administered Date(s) Administered  . Influenza-Unspecified 08/18/2015  . Tdap 04/17/2014  Outpatient Encounter Medications as of 11/18/2018  Medication Sig  . loratadine (CLARITIN) 10 MG tablet Take by mouth.  . Butoconazole Nitrate, 1 Dose, 2 % CREA Place 1 application vaginally daily. (Patient not taking: Reported on 11/18/2018)  . fluconazole (DIFLUCAN) 150 MG tablet Take 1 tablet (150 mg total) by mouth every 3 (three) days. (Patient not taking: Reported on 11/18/2018)  . metroNIDAZOLE (FLAGYL) 500 MG tablet Take 1 tablet (500 mg total) by mouth 2 (two) times daily. (Patient not taking: Reported on 11/18/2018)   No facility-administered encounter medications on file as of 11/18/2018.      ROS: Pertinent positives and negatives noted in HPI. Remainder of ROS non-contributory   No Known Allergies  BP 120/90   Pulse 81   Temp 97.9 F (36.6 C) (Oral)   Ht 5\' 11"  (1.803 m)   Wt 190 lb 12.8 oz (86.5 kg)   SpO2 98%   BMI 26.61 kg/m   Physical Exam  Constitutional: She is oriented to person,  place, and time. She appears well-developed and well-nourished. No distress.  Neck: No tracheal deviation present. Thyromegaly (diffusely enlarged without discrete palpable nodule(s)) present.  Cardiovascular: Normal rate, regular rhythm and normal heart sounds.  Pulmonary/Chest: Effort normal and breath sounds normal. No respiratory distress.  Musculoskeletal:        General: No edema.  Lymphadenopathy:    She has no cervical adenopathy.  Neurological: She is alert and oriented to person, place, and time.  Psychiatric: She has a normal mood and affect. Her behavior is normal.     A/P:  1. Encounter to establish care with new doctor - pt to schedule CPE appt as she is due for exam, fasting labs, PAP, mammo referral  2. Abnormal thyroid function test 3. Thyroid goiter - TSH - T4, free - T3 - will contact pt with results of labs - Ambulatory referral to Endocrinology - US THYROID; Future

## 2018-11-19 ENCOUNTER — Ambulatory Visit: Payer: Self-pay | Admitting: Family Medicine

## 2018-11-19 ENCOUNTER — Encounter: Payer: Self-pay | Admitting: Family Medicine

## 2018-11-19 LAB — T3: T3, Total: 111 ng/dL (ref 76–181)

## 2018-12-02 ENCOUNTER — Ambulatory Visit (HOSPITAL_COMMUNITY): Payer: No Typology Code available for payment source

## 2018-12-05 NOTE — Progress Notes (Deleted)
Patient ID: Felicia Campos, female   DOB: 22-Aug-1971, 48 y.o.   MRN: [id number]             Reason for Appointment: Evaluation of thyroid nodule    History of Present Illness:   The patient is being referred by  The patient's thyroid nodule was first discovered   The thyroid ultrasound showed the following   Lab Results  Component Value Date   FREET4 0.99 11/18/2018   FREET4 0.90 07/29/2013   FREET4 1.16 07/29/2013   TSH 0.37 11/18/2018   TSH 0.65 07/29/2013   TSH 0.704 07/29/2013   Lab Results  Component Value Date   T3TOTAL 111 11/18/2018     Allergies as of 12/06/2018   No Known Allergies     Medication List       Accurate as of December 05, 2018  9:13 PM. Always use your most recent med list.        Butoconazole Nitrate (1 Dose) 2 % Crea Place 1 application vaginally daily.   fluconazole 150 MG tablet Commonly known as:  DIFLUCAN Take 1 tablet (150 mg total) by mouth every 3 (three) days.   loratadine 10 MG tablet Commonly known as:  CLARITIN Take by mouth.   metroNIDAZOLE 500 MG tablet Commonly known as:  FLAGYL Take 1 tablet (500 mg total) by mouth 2 (two) times daily.       Allergies: No Known Allergies  No past medical history on file.  There is no history of radiation to the neck in childhood  Past Surgical History:  Procedure Laterality Date  . CESAREAN SECTION  2005  . INGUINAL HERNIA REPAIR  2005  . TUBAL LIGATION      Family History  Problem Relation Age of Onset  . Stroke Mother        7  . High Cholesterol Father   . Heart disease Maternal Grandmother   . Cancer Paternal Grandmother        breast cancer    Social History:  reports that she has never smoked. She has never used smokeless tobacco. She reports current alcohol use of about 7.0 standard drinks of alcohol per week. She reports that she does not use drugs.   Review of Systems    Examination:   There were no vitals taken for this visit.   General  Appearance:  well-looking        Eyes: No abnormal prominence or swelling of the eyes          THYROID: Thyroid nodule is palpable on.  There is no lymphadenopathy in the neck   Heart sounds normal Lungs clear Abdomen shows no hepatosplenomegaly or other mass.    Reflexes at biceps are normal.  Skin: No rash or lesions Extremities: No edema  Assessment/Plan:  Thyroid nodule:   Consultation note sent to the referring physician  Elayne Snare 12/05/2018

## 2018-12-06 ENCOUNTER — Ambulatory Visit: Payer: No Typology Code available for payment source | Admitting: Endocrinology

## 2018-12-06 DIAGNOSIS — Z0289 Encounter for other administrative examinations: Secondary | ICD-10-CM

## 2018-12-08 ENCOUNTER — Ambulatory Visit (HOSPITAL_COMMUNITY): Payer: No Typology Code available for payment source | Attending: Family Medicine

## 2019-06-27 ENCOUNTER — Telehealth: Payer: Self-pay

## 2019-06-27 DIAGNOSIS — E049 Nontoxic goiter, unspecified: Secondary | ICD-10-CM

## 2019-06-27 DIAGNOSIS — R946 Abnormal results of thyroid function studies: Secondary | ICD-10-CM

## 2019-06-27 NOTE — Telephone Encounter (Signed)
Dr. Loletha Grayer please advise okay to enter referral?  Copied from [place] [phone]. Topic: Referral - Request for Referral >> Jun 27, 2019  3:42 PM Felicia Campos wrote: Has patient seen PCP for this complaint? Yes.   *If NO, is insurance requiring patient see PCP for this issue before PCP can refer them? Referral for which specialty: endo Preferred provider/office: Dr Dwyane Dee  Reason for referral:  Please call [phone]

## 2019-06-29 ENCOUNTER — Other Ambulatory Visit: Payer: Self-pay

## 2019-06-29 ENCOUNTER — Ambulatory Visit (HOSPITAL_COMMUNITY)
Admission: RE | Admit: 2019-06-29 | Discharge: 2019-06-29 | Disposition: A | Discharge status: Home / Self Care | Payer: No Typology Code available for payment source | Source: Ambulatory Visit | Attending: Family Medicine | Admitting: Family Medicine

## 2019-06-29 DIAGNOSIS — R946 Abnormal results of thyroid function studies: Secondary | ICD-10-CM | POA: Diagnosis present

## 2019-06-29 DIAGNOSIS — E049 Nontoxic goiter, unspecified: Secondary | ICD-10-CM | POA: Insufficient documentation

## 2019-06-29 NOTE — Addendum Note (Signed)
Addended by: Janan Ridge on: 06/29/2019 08:29 AM   Modules accepted: Orders

## 2019-06-29 NOTE — Telephone Encounter (Signed)
Pt aware of referral placed.

## 2019-06-29 NOTE — Telephone Encounter (Signed)
Yes ok to place referral. Thank you.

## 2019-07-29 ENCOUNTER — Encounter: Payer: Self-pay | Admitting: Family Medicine

## 2019-07-29 ENCOUNTER — Other Ambulatory Visit: Payer: Self-pay

## 2019-07-29 ENCOUNTER — Ambulatory Visit (INDEPENDENT_AMBULATORY_CARE_PROVIDER_SITE_OTHER): Payer: No Typology Code available for payment source | Admitting: Family Medicine

## 2019-07-29 VITALS — BP 136/78 | HR 83 | Temp 98.0°F | Wt 191.8 lb

## 2019-07-29 DIAGNOSIS — R109 Unspecified abdominal pain: Secondary | ICD-10-CM | POA: Diagnosis not present

## 2019-07-29 LAB — POCT URINALYSIS DIPSTICK
Bilirubin, UA: NEGATIVE
Blood, UA: NEGATIVE
Glucose, UA: NEGATIVE
Ketones, UA: NEGATIVE
Leukocytes, UA: NEGATIVE
Nitrite, UA: NEGATIVE
Protein, UA: NEGATIVE
Spec Grav, UA: 1.01 (ref 1.010–1.025)
Urobilinogen, UA: 0.2 E.U./dL
pH, UA: 7 (ref 5.0–8.0)

## 2019-07-29 MED ORDER — NAPROXEN 500 MG PO TABS: 500.0000 mg | ORAL_TABLET | Freq: Two times a day (BID) | ORAL | 0 refills | Status: DC

## 2019-07-29 NOTE — Assessment & Plan Note (Signed)
-  POCT UA is normal.  -Likely MSK cause, will add on naproxen BID as needed.  -HEP handout given.  -Call for new or worsening symptoms.

## 2019-07-29 NOTE — Progress Notes (Signed)
Felicia Campos - 48 y.o. female MRN [id number]  Date of birth: [birth year]  Subjective Chief Complaint  Patient presents with  . Back Pain    HPI Felicia Campos is a 48 y.o. female here today with complaint of bilateral back/flank pain.  She reports that symptoms started about 3-4 days ago.  She denies any known preceding injury or overuse.  She denies urinary symptoms including frequency, dysuria, urgency, fever, chills or nausea.  Bowels are moving normally.  Moving or twisting does seem to make this a little worse and she has a "pulling sensation" with it.   ROS:  A comprehensive ROS was completed and negative except as noted per HPI  No Known Allergies  No past medical history on file.  Past Surgical History:  Procedure Laterality Date  . CESAREAN SECTION  2005  . INGUINAL HERNIA REPAIR  2005  . TUBAL LIGATION      Social History   Socioeconomic History  . Marital status: Married    Spouse name: Not on file  . Number of children: Not on file  . Years of education: Not on file  . Highest education level: Not on file  Occupational History  . 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 Financial resource strain: Not on file  . Food insecurity    Worry: Not on file    Inability: Not on file  . Transportation needs    Medical: Not on file    Non-medical: Not on file  Tobacco Use  . Smoking status: Never Smoker  . Smokeless tobacco: Never Used  Substance and Sexual Activity  . Alcohol use: Yes    Alcohol/week: 7.0 standard drinks    Types: 7 Standard drinks or equivalent per week  . Drug use: No  . Sexual activity: Yes    Partners: Male  Lifestyle  . Physical activity    Days per week: Not on file    Minutes per session: Not on file  . Stress: Not on file  Relationships  . Social Herbalist on phone: Not on file    Gets together: Not on file    Attends religious service: Not on file    Active member of club or organization: Not  on file    Attends meetings of clubs or organizations: Not on file    Relationship status: Not on file  Other Topics Concern  . 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      Regular exercise: walk; 20 min daily   Caffeine use: 1 soda daily    Family History  Problem Relation Age of Onset  . Stroke Mother        69  . High Cholesterol Father   . Heart disease Maternal Grandmother   . Cancer Paternal Grandmother        breast cancer    Health Maintenance  Topic Date Due  . PAP SMEAR-Modifier  08/23/1990  . INFLUENZA VACCINE  06/18/2019  . TETANUS/TDAP  04/17/2024  . HIV Screening  Completed    ----------------------------------------------------------------------------------------------------------------------------------------------------------------------------------------------------------------- Physical Exam BP 136/78 (BP Location: Left Arm, Patient Position: Sitting, Cuff Size: Normal)   Pulse 83   Temp 98 F (36.7 C) (Oral)   Wt 191 lb 12.8 oz (87 kg)   LMP 05/28/2019   SpO2 98%   BMI 26.75 kg/m   Physical Exam Constitutional:  Appearance: Normal appearance.  HENT:     Head: Normocephalic and atraumatic.     Mouth/Throat:     Mouth: Mucous membranes are moist.  Eyes:     General: No scleral icterus. Cardiovascular:     Rate and Rhythm: Normal rate and regular rhythm.  Pulmonary:     Effort: Pulmonary effort is normal.     Breath sounds: Normal breath sounds.  Abdominal:     Tenderness: There is no right CVA tenderness or left CVA tenderness.  Skin:    General: Skin is warm and dry.     Findings: No rash.  Neurological:     General: No focal deficit present.     Mental Status: She is alert.  Psychiatric:        Mood and Affect: Mood normal.        Behavior: Behavior normal.      ------------------------------------------------------------------------------------------------------------------------------------------------------------------------------------------------------------------- Assessment and Plan  Bilateral flank pain -POCT UA is normal.  -Likely MSK cause, will add on naproxen BID as needed.  -HEP handout given.  -Call for new or worsening symptoms.

## 2019-07-29 NOTE — Patient Instructions (Signed)
Ask your health care provider which exercises are safe for you. Do exercises exactly as told by your health care provider and adjust them as directed. It is normal to feel mild stretching, pulling, tightness, or discomfort as you do these exercises. Stop right away if you feel sudden pain or your pain gets worse. Do not begin these exercises until told by your health care provider. Stretching and range-of-motion exercises These exercises warm up your muscles and joints and improve the movement and flexibility of your back. These exercises also help to relieve pain, numbness, and tingling. Lumbar rotation  1. Lie on your back on a firm surface and bend your knees. 2. Straighten your arms out to your sides so each arm forms a 90-degree angle (right angle) with a side of your body. 3. Slowly move (rotate) both of your knees to one side of your body until you feel a stretch in your lower back (lumbar). Try not to let your shoulders lift off the floor. 4. Hold this position for __________ seconds. 5. Tense your abdominal muscles and slowly move your knees back to the starting position. 6. Repeat this exercise on the other side of your body. Repeat __________ times. Complete this exercise __________ times a day. Single knee to chest  1. Lie on your back on a firm surface with both legs straight. 2. Bend one of your knees. Use your hands to move your knee up toward your chest until you feel a gentle stretch in your lower back and buttock. ? Hold your leg in this position by holding on to the front of your knee. ? Keep your other leg as straight as possible. 3. Hold this position for __________ seconds. 4. Slowly return to the starting position. 5. Repeat with your other leg. Repeat __________ times. Complete this exercise __________ times a day. Prone extension on elbows  1. Lie on your abdomen on a firm surface (prone position). 2. Prop yourself up on your elbows. 3. Use your arms to help lift  your chest up until you feel a gentle stretch in your abdomen and your lower back. ? This will place some of your body weight on your elbows. If this is uncomfortable, try stacking pillows under your chest. ? Your hips should stay down, against the surface that you are lying on. Keep your hip and back muscles relaxed. 4. Hold this position for __________ seconds. 5. Slowly relax your upper body and return to the starting position. Repeat __________ times. Complete this exercise __________ times a day. Strengthening exercises These exercises build strength and endurance in your back. Endurance is the ability to use your muscles for a long time, even after they get tired. Pelvic tilt This exercise strengthens the muscles that lie deep in the abdomen. 1. Lie on your back on a firm surface. Bend your knees and keep your feet flat on the floor. 2. Tense your abdominal muscles. Tip your pelvis up toward the ceiling and flatten your lower back into the floor. ? To help with this exercise, you may place a small towel under your lower back and try to push your back into the towel. 3. Hold this position for __________ seconds. 4. Let your muscles relax completely before you repeat this exercise. Repeat __________ times. Complete this exercise __________ times a day. Alternating arm and leg raises  1. Get on your hands and knees on a firm surface. If you are on a hard floor, you may want to use padding, such  as an exercise mat, to cushion your knees. 2. Line up your arms and legs. Your hands should be directly below your shoulders, and your knees should be directly below your hips. 3. Lift your left leg behind you. At the same time, raise your right arm and straighten it in front of you. ? Do not lift your leg higher than your hip. ? Do not lift your arm higher than your shoulder. ? Keep your abdominal and back muscles tight. ? Keep your hips facing the ground. ? Do not arch your back. ? Keep your  balance carefully, and do not hold your breath. 4. Hold this position for __________ seconds. 5. Slowly return to the starting position. 6. Repeat with your right leg and your left arm. Repeat __________ times. Complete this exercise __________ times a day. Abdominal set with straight leg raise  1. Lie on your back on a firm surface. 2. Bend one of your knees and keep your other leg straight. 3. Tense your abdominal muscles and lift your straight leg up, 4-6 inches (10-15 cm) off the ground. 4. Keep your abdominal muscles tight and hold this position for __________ seconds. ? Do not hold your breath. ? Do not arch your back. Keep it flat against the ground. 5. Keep your abdominal muscles tense as you slowly lower your leg back to the starting position. 6. Repeat with your other leg. Repeat __________ times. Complete this exercise __________ times a day. Single leg lower with bent knees 1. Lie on your back on a firm surface. 2. Tense your abdominal muscles and lift your feet off the floor, one foot at a time, so your knees and hips are bent in 90-degree angles (right angles). ? Your knees should be over your hips and your lower legs should be parallel to the floor. 3. Keeping your abdominal muscles tense and your knee bent, slowly lower one of your legs so your toe touches the ground. 4. Lift your leg back up to return to the starting position. ? Do not hold your breath. ? Do not let your back arch. Keep your back flat against the ground. 5. Repeat with your other leg. Repeat __________ times. Complete this exercise __________ times a day. Posture and body mechanics Good posture and healthy body mechanics can help to relieve stress in your body's tissues and joints. Body mechanics refers to the movements and positions of your body while you do your daily activities. Posture is part of body mechanics. Good posture means:  Your spine is in its natural S-curve position (neutral).  Your  shoulders are pulled back slightly.  Your head is not tipped forward. Follow these guidelines to improve your posture and body mechanics in your everyday activities. Standing   When standing, keep your spine neutral and your feet about hip width apart. Keep a slight bend in your knees. Your ears, shoulders, and hips should line up.  When you do a task in which you stand in one place for a long time, place one foot up on a stable object that is 2-4 inches (5-10 cm) high, such as a footstool. This helps keep your spine neutral. Sitting   When sitting, keep your spine neutral and keep your feet flat on the floor. Use a footrest, if necessary, and keep your thighs parallel to the floor. Avoid rounding your shoulders, and avoid tilting your head forward.  When working at a desk or a computer, keep your desk at a height where your hands  are slightly lower than your elbows. Slide your chair under your desk so you are close enough to maintain good posture.  When working at a computer, place your monitor at a height where you are looking straight ahead and you do not have to tilt your head forward or downward to look at the screen. Resting  When lying down and resting, avoid positions that are most painful for you.  If you have pain with activities such as sitting, bending, stooping, or squatting, lie in a position in which your body does not bend very much. For example, avoid curling up on your side with your arms and knees near your chest (fetal position).  If you have pain with activities such as standing for a long time or reaching with your arms, lie with your spine in a neutral position and bend your knees slightly. Try the following positions: ? Lying on your side with a pillow between your knees. ? Lying on your back with a pillow under your knees. Lifting   When lifting objects, keep your feet at least shoulder width apart and tighten your abdominal muscles.  Bend your knees and hips  and keep your spine neutral. It is important to lift using the strength of your legs, not your back. Do not lock your knees straight out.  Always ask for help to lift heavy or awkward objects. This information is not intended to replace advice given to you by your health care provider. Make sure you discuss any questions you have with your health care provider. Document Released: 11/03/2005 Document Revised: 02/25/2019 Document Reviewed: 11/25/2018 Elsevier Patient Education  2020 [employer].

## 2019-08-09 ENCOUNTER — Other Ambulatory Visit: Payer: Self-pay

## 2019-08-09 ENCOUNTER — Encounter: Payer: Self-pay | Admitting: Endocrinology

## 2019-08-09 ENCOUNTER — Ambulatory Visit (INDEPENDENT_AMBULATORY_CARE_PROVIDER_SITE_OTHER): Payer: No Typology Code available for payment source | Admitting: Endocrinology

## 2019-08-09 VITALS — BP 122/80 | HR 74 | Ht 71.0 in | Wt 192.2 lb

## 2019-08-09 DIAGNOSIS — E161 Other hypoglycemia: Secondary | ICD-10-CM

## 2019-08-09 DIAGNOSIS — E042 Nontoxic multinodular goiter: Secondary | ICD-10-CM | POA: Diagnosis not present

## 2019-08-09 DIAGNOSIS — Z13220 Encounter for screening for lipoid disorders: Secondary | ICD-10-CM | POA: Diagnosis not present

## 2019-08-09 LAB — LIPID PANEL
Cholesterol: 225 mg/dL — ABNORMAL HIGH (ref 0–200)
HDL: 82.5 mg/dL (ref >39.00)
LDL Cholesterol: 131 mg/dL — ABNORMAL HIGH (ref 0–99)
NonHDL: 142.6
Total CHOL/HDL Ratio: 3
Triglycerides: 60 mg/dL (ref 0.0–149.0)
VLDL: 12 mg/dL (ref 0.0–40.0)

## 2019-08-09 LAB — GLUCOSE, RANDOM: Glucose, Bld: 103 mg/dL — ABNORMAL HIGH (ref 70–99)

## 2019-08-09 LAB — T3, FREE: T3, Free: 3.2 pg/mL (ref 2.3–4.2)

## 2019-08-09 LAB — T4, FREE: Free T4: 0.86 ng/dL (ref 0.60–1.60)

## 2019-08-09 LAB — TSH: TSH: 0.37 u[IU]/mL (ref 0.35–4.50)

## 2019-08-09 NOTE — Progress Notes (Signed)
Patient ID: Felicia Campos, female   DOB: 04-12-1971, 48 y.o.   MRN: [id number]           Reason for Appointment: Goiter, new consultation    History of Present Illness:   Patient has been referred by Dr. Bryan Lemma,    The patient's thyroid enlargement was first discovered in 2014 as an incidental finding Prior to that she was told that her thyroid levels had been abnormal at times either high or low but no treatment given  Initially ultrasound showed the following: Inhomogeneous, isoechoic solid nodule in the mid to lower pole measuring 4.5 x 3.1 x 1.7 cm.  Needle aspiration biopsy in 2014 showed nonneoplastic goiter  Subsequently she started feeling thyroid enlargement in 2017 after it was noticed by her family member This was evaluated in 2018 by an endocrinologist at [facility] Needle aspiration was done and also since she had a 3.5 cm cyst on the left lobe this was aspirated and injected with alcohol After this her thyroid swelling remained controlled for about a year  For the last couple years she has had gradual enlargement of her thyroid nodule although this feels different from time to time She can feel this on swallowing and has a mild pressure sensation but no difficulty with breathing  She has had no difficulty with swallowing foods or liquids Does not feel like she has any choking sensation in her neck  Her most recent ultrasound was in 06/2019 as described below  Also previously at times would have had problems with feeling excessively warm and sweaty, occasionally having anxiety, shakiness and palpitations she does not have any significant symptoms now except mild occasional palpitations  Lab Results  Component Value Date   FREET4 0.99 11/18/2018   FREET4 0.90 07/29/2013   FREET4 1.16 07/29/2013   TSH 0.37 11/18/2018   TSH 0.65 07/29/2013   TSH 0.704 07/29/2013    She has had an ultrasound exam in 8/20 with the following findings   Nodule # 1:  Location:  Right; Inferior  Maximum size: 1.6 cm; Other 2 dimensions: 1.2 cm x 1.0 cm  Composition: mixed cystic and solid (1)  Echogenicity: hypoechoic (2)  Shape: not taller-than-wide (0)  Margins: smooth (0) Echogenic foci: none (0)  ACR TI-RADS risk category: TR3 (3 points).  ACR TI-RADS recommendations: Nodule meets criteria for surveillance  _________________________________________________________  Nodule # 2:  Location: Left; Superior  Maximum size: 6.1 cm; Other 2 dimensions: 4.0 cm x 6.0 cm  Composition: spongiform (0)    Allergies as of 08/09/2019   No Known Allergies     Medication List       Accurate as of August 09, 2019  4:54 PM. If you have any questions, ask your nurse or doctor.        STOP taking these medications   naproxen 500 MG tablet Commonly known as: Naprosyn Stopped by: Elayne Snare, MD     TAKE these medications   loratadine 10 MG tablet Commonly known as: CLARITIN Take by mouth.   MULTI-VITAMIN DAILY PO Take 1 tablet by mouth daily. Take 1 tablet by mouth once daily.       Allergies: No Known Allergies  History reviewed. No pertinent past medical history.  Past Surgical History:  Procedure Laterality Date  . CESAREAN SECTION  2005  . INGUINAL HERNIA REPAIR  2005  . TUBAL LIGATION      Family History  Problem Relation Age of Onset  . Stroke Mother  67  . High Cholesterol Father   . Heart disease Maternal Grandmother   . Cancer Paternal Grandmother        breast cancer  . Diabetes Neg Hx   . Thyroid disease Neg Hx     Social History:  reports that she has never smoked. She has never used smokeless tobacco. She reports current alcohol use of about 7.0 standard drinks of alcohol per week. She reports that she does not use drugs.   Review of Systems  Constitutional: Negative for weight loss.  HENT: Negative for trouble swallowing.   Respiratory: Negative for shortness of breath.   Cardiovascular:  Positive for palpitations.  Gastrointestinal: Negative for abdominal pain.  Endocrine: Positive for menstrual changes.       Having infrequent cycles in the last year  Musculoskeletal: Negative for joint pain.  Skin: Negative for rash.  Neurological: Negative for weakness.      Examination:   BP 122/80 (BP Location: Left Arm, Patient Position: Sitting, Cuff Size: Normal)   Pulse 74   Ht 5\' 11"  (1.803 m)   Wt 192 lb 3.2 oz (87.2 kg)   SpO2 98%   BMI 26.81 kg/m    General Appearance: pleasant, averagely built and nourished         Eyes: No prominence or swelling.           Neck: The thyroid is enlarged with significant prominence in the midline area Most of the enlargement is palpable as relatively soft starting from the midline and extending towards the left and measuring about 5-5.5 cm  Neck circumference is 38 cm over the mid thyroid  There is no stridor  There is no lymphadenopathy.     Cardiovascular: Normal  heart sounds, no murmur Respiratory:  Lungs clear  Neurological: REFLEXES: at biceps are normal.  No tremor present  Skin: no rash        Assessment/Plan:  Multinodular goiter with dominant nodule being a spongiform nodule measuring about 6 cm and present since at least 2014 This has been shown to be benign on biopsy twice and no intervention has been recommended based on ultrasound characteristics She is currently concerned about the size of the nodule and the prominent appearance in front of her neck Does not however have any significant pressure symptoms  She also has a 1.6 cm mixed cystic and solid lesion on the right side that has been recommended observation only  She probably does have mild autonomous thyroid function also with low normal TSH consistently Currently asymptomatic and looks euthyroid on exam  Recommendations: Recheck thyroid levels today  Since she is concerned about the appearance of her neck and presence of a large thyroid nodule  she wants to consider surgery for this.  However she is wanting to avoid total thyroidectomy Will recommend surgical consultation for her to discuss options including hemithyroidectomy  Discussed that since her large nodule on the left is primarily spongiform this will likely not respond to needle aspiration drainage and likely may not benefit from another alcohol injection Currently local specialists are not doing alcohol injections also  GENERAL: She is wanting to know if she is having any issues with hypercholesterolemia because of family history and will check the labs today Also because of her previous history of possible reactive hypoglycemia will check her glucose also today   A copy of the consultation note is sent to the referring physician  Elayne Snare 08/09/2019

## 2019-10-12 ENCOUNTER — Ambulatory Visit: Payer: No Typology Code available for payment source | Admitting: Family Medicine

## 2020-02-04 ENCOUNTER — Other Ambulatory Visit: Payer: Self-pay | Admitting: Endocrinology

## 2020-02-04 DIAGNOSIS — E042 Nontoxic multinodular goiter: Secondary | ICD-10-CM

## 2020-02-06 ENCOUNTER — Other Ambulatory Visit: Payer: No Typology Code available for payment source

## 2020-02-09 ENCOUNTER — Ambulatory Visit: Payer: No Typology Code available for payment source | Admitting: Endocrinology

## 2021-01-21 NOTE — Patient Instructions (Addendum)
Health Maintenance Due  Topic Date Due  . Hepatitis C Screening  Never done  . PAP SMEAR-Modifier  Never done  . COLONOSCOPY (Pts 45-64yrs Insurance coverage will need to be confirmed)  Never done  . MAMMOGRAM  Never done  . INFLUENZA VACCINE  06/17/2020    Depression screen [facility] 2/9 07/29/2019 08/30/2015  Decreased Interest 0 0  Down, Depressed, Hopeless 0 0  PHQ - 2 Score 0 0    Benign Positional Vertigo Vertigo is the feeling that you or your surroundings are moving when they are not. Benign positional vertigo is the most common form of vertigo. This is usually a harmless condition (benign). This condition is positional. This means that symptoms are triggered by certain movements and positions. This condition can be dangerous if it occurs while you are doing something that could cause harm to you or others. This includes activities such as driving or operating machinery. What are the causes? The inner ear has fluid-filled canals that help your brain sense movement and balance. When the fluid moves, the brain receives messages about your body's position. With benign positional vertigo, crystals in the inner ear break free and disturb the inner ear area. This causes your brain to receive confusing messages about your body's position. What increases the risk? You are more likely to develop this condition if:  You are a woman.  You are 73 years of age or older.  You have recently had a head injury.  You have an inner ear disease. What are the signs or symptoms? Symptoms of this condition usually happen when you move your head or your eyes in different directions. Symptoms may start suddenly, and usually last for less than a minute. They include:  Loss of balance and falling.  Feeling like you are spinning or moving.  Feeling like your surroundings are spinning or moving.  Nausea and vomiting.  Blurred vision.  Dizziness.  Involuntary eye movement (nystagmus). Symptoms can  be mild and cause only minor problems, or they can be severe and interfere with daily life. Episodes of benign positional vertigo may return (recur) over time. Symptoms may improve over time. How is this diagnosed? This condition may be diagnosed based on:  Your medical history.  Physical exam of the head, neck, and ears.  Positional tests to check for or stimulate vertigo. You may be asked to turn your head and change positions, such as going from sitting to lying down. A health care provider will watch for symptoms of vertigo. You may be referred to a health care provider who specializes in ear, nose, and throat problems (ENT, or otolaryngologist) or a provider who specializes in disorders of the nervous system (neurologist). How is this treated? This condition may be treated in a session in which your health care provider moves your head in specific positions to help the displaced crystals in your inner ear move. Treatment for this condition may take several sessions. Surgery may be needed in severe cases, but this is rare. In some cases, benign positional vertigo may resolve on its own in 2-4 weeks.   Follow these instructions at home: Safety  Move slowly. Avoid sudden body or head movements or certain positions, as told by your health care provider.  Avoid driving until your health care provider says it is safe for you to do so.  Avoid operating heavy machinery until your health care provider says it is safe for you to do so.  Avoid doing any tasks that would  be dangerous to you or others if vertigo occurs.  If you have trouble walking or keeping your balance, try using a cane for stability. If you feel dizzy or unstable, sit down right away.  Return to your normal activities as told by your health care provider. Ask your health care provider what activities are safe for you. General instructions  Take over-the-counter and prescription medicines only as told by your health care  provider.  Drink enough fluid to keep your urine pale yellow.  Keep all follow-up visits as told by your health care provider. This is important. Contact a health care provider if:  You have a fever.  Your condition gets worse or you develop new symptoms.  Your family or friends notice any behavioral changes.  You have nausea or vomiting that gets worse.  You have numbness or a prickling and tingling sensation. Get help right away if you:  Have difficulty speaking or moving.  Are always dizzy.  Faint.  Develop severe headaches.  Have weakness in your legs or arms.  Have changes in your hearing or vision.  Develop a stiff neck.  Develop sensitivity to light. Summary  Vertigo is the feeling that you or your surroundings are moving when they are not. Benign positional vertigo is the most common form of vertigo.  This condition is caused by crystals in the inner ear that become displaced. This causes a disturbance in an area of the inner ear that helps your brain sense movement and balance.  Symptoms include loss of balance and falling, feeling that you or your surroundings are moving, nausea and vomiting, and blurred vision.  This condition can be diagnosed based on symptoms, a physical exam, and positional tests.  Follow safety instructions as told by your health care provider. You will also be told when to contact your health care provider in case of problems. This information is not intended to replace advice given to you by your health care provider. Make sure you discuss any questions you have with your health care provider. Document Revised: 09/27/2019 Document Reviewed: 04/14/2018 Elsevier Patient Education  2021 [employer].

## 2021-01-22 ENCOUNTER — Other Ambulatory Visit: Payer: Self-pay

## 2021-01-23 ENCOUNTER — Encounter: Payer: Self-pay | Admitting: Family Medicine

## 2021-01-23 ENCOUNTER — Ambulatory Visit (INDEPENDENT_AMBULATORY_CARE_PROVIDER_SITE_OTHER): Payer: 59 | Admitting: Family Medicine

## 2021-01-23 VITALS — BP 120/78 | HR 86 | Temp 97.8°F | Wt 169.8 lb

## 2021-01-23 DIAGNOSIS — R0683 Snoring: Secondary | ICD-10-CM | POA: Diagnosis not present

## 2021-01-23 DIAGNOSIS — E049 Nontoxic goiter, unspecified: Secondary | ICD-10-CM | POA: Diagnosis not present

## 2021-01-23 DIAGNOSIS — R5383 Other fatigue: Secondary | ICD-10-CM | POA: Diagnosis not present

## 2021-01-23 DIAGNOSIS — Z13220 Encounter for screening for lipoid disorders: Secondary | ICD-10-CM

## 2021-01-23 DIAGNOSIS — H8113 Benign paroxysmal vertigo, bilateral: Secondary | ICD-10-CM | POA: Diagnosis not present

## 2021-01-23 DIAGNOSIS — R002 Palpitations: Secondary | ICD-10-CM

## 2021-01-23 DIAGNOSIS — R7989 Other specified abnormal findings of blood chemistry: Secondary | ICD-10-CM

## 2021-01-23 DIAGNOSIS — Z131 Encounter for screening for diabetes mellitus: Secondary | ICD-10-CM

## 2021-01-23 MED ORDER — MECLIZINE HCL 25 MG PO TABS: 25.0000 mg | ORAL_TABLET | Freq: Three times a day (TID) | ORAL | 0 refills | Status: DC | PRN

## 2021-01-23 NOTE — Progress Notes (Signed)
Felicia Campos is a 50 y.o. female  Chief Complaint  Patient presents with  . Dizziness    Dizzy spells that come and go x 6 months patient states that she also feels dizzy sometimes while sleeping. Would like to discuss possible sleep apnea. Heart sometimes feel like it is racing.     HPI: Felicia Campos is a 50 y.o. female who complains of 6 mo h/o intermittent episodes of dizziness, mostly occurring when she turns her head to the side or changes position.  Lasts 15-30 sec.  No n/v, headaches, CP. Some SOB.   Pt wonders about sleep apnea. Daughter says she snores. No AM headache. + daytime fatigue. She wakes up gasping and with heart racing.  Pt walks 10 miles per week. No issues or change in endurance level. No CP, DOE, palpitations with exertion.   No past medical history on file.  Past Surgical History:  Procedure Laterality Date  . CESAREAN SECTION  2005  . INGUINAL HERNIA REPAIR  2005  . TUBAL LIGATION      Social History   Socioeconomic History  . Marital status: Married    Spouse name: Not on file  . Number of children: Not on file  . Years of education: Not on file  . Highest education level: Not on file  Occupational History  . 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 Smoking status: Never Smoker  . Smokeless tobacco: Never Used  Substance and Sexual Activity  . Alcohol use: Yes    Alcohol/week: 7.0 standard drinks    Types: 7 Standard drinks or equivalent per week  . Drug use: No  . Sexual activity: Yes    Partners: Male  Other Topics Concern  . 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      Regular exercise: walk; 20 min daily   Caffeine use: 1 soda daily   Social Determinants of Health   Financial Resource Strain: Not on file  Food Insecurity: Not on file  Transportation Needs: Not on file  Physical Activity: Not on file  Stress: Not on file  Social Connections:  Not on file  Intimate Partner Violence: Not on file    Family History  Problem Relation Age of Onset  . Stroke Mother        19  . High Cholesterol Father   . Heart disease Maternal Grandmother   . Cancer Paternal Grandmother        breast cancer  . Diabetes Neg Hx   . Thyroid disease Neg Hx      Immunization History  Administered Date(s) Administered  . Influenza Split 08/17/2015, 08/29/2016, 08/31/2020  . Influenza-Unspecified 08/18/2015  . Janssen (J&J) SARS-COV-2 Vaccination 02/25/2020  . PPD Test 09/03/2020  . Tdap 01/17/2013, 04/17/2014    Outpatient Encounter Medications as of 01/23/2021  Medication Sig  . loratadine (CLARITIN) 10 MG tablet Take by mouth.  . meclizine (ANTIVERT) 25 MG tablet Take 1 tablet (25 mg total) by mouth 3 (three) times daily as needed for dizziness.  . Multiple Vitamin (MULTI-VITAMIN DAILY PO) Take 1 tablet by mouth daily. Take 1 tablet by mouth once daily. (Patient not taking: Reported on 01/23/2021)   No facility-administered encounter medications on file as of 01/23/2021.     ROS: Pertinent positives and negatives noted in HPI. Remainder of ROS non-contributory    No Known Allergies  BP 120/78   Pulse 86   Temp 97.8 F (36.6 C) (Temporal)   Wt 169 lb 12.8 oz (77 kg)   SpO2 96%   BMI 23.68 kg/m   Wt Readings from Last 3 Encounters:  01/23/21 169 lb 12.8 oz (77 kg)  08/09/19 192 lb 3.2 oz (87.2 kg)  07/29/19 191 lb 12.8 oz (87 kg)   Temp Readings from Last 3 Encounters:  01/23/21 97.8 F (36.6 C) (Temporal)  07/29/19 98 F (36.7 C) (Oral)  11/18/18 97.9 F (36.6 C) (Oral)   BP Readings from Last 3 Encounters:  01/23/21 120/78  08/09/19 122/80  07/29/19 136/78   Pulse Readings from Last 3 Encounters:  01/23/21 86  08/09/19 74  07/29/19 83     Physical Exam Constitutional:      General: She is not in acute distress.    Appearance: Normal appearance. She is not ill-appearing.  HENT:     Right Ear: Tympanic  membrane and ear canal normal.     Left Ear: Tympanic membrane and ear canal normal.  Eyes:     Extraocular Movements:     Right eye: No nystagmus.     Left eye: No nystagmus.     Conjunctiva/sclera: Conjunctivae normal.  Cardiovascular:     Rate and Rhythm: Normal rate and regular rhythm.     Pulses: Normal pulses.     Heart sounds: Normal heart sounds.  Pulmonary:     Effort: Pulmonary effort is normal. No respiratory distress.     Breath sounds: Normal breath sounds.  Musculoskeletal:     Right lower leg: No edema.     Left lower leg: No edema.  Neurological:     General: No focal deficit present.     Mental Status: She is alert and oriented to person, place, and time.     Coordination: Coordination normal.     Gait: Gait normal.  Psychiatric:        Mood and Affect: Mood normal.        Behavior: Behavior normal.      A/P:  1. Snoring - also daytime fatigue - I think eval for sleep apnea is appropriate - Ambulatory referral to Pulmonology  2. Other fatigue - Ambulatory referral to Pulmonology - Iron, TIBC and Ferritin Panel; Future - TSH; Future - CBC; Future - could be due to poor sleep/sleep apnea, stress/anxiety may also play a role  3. Thyroid goiter - TSH; Future - T4, free; Future  4. Screening for lipid disorders - Lipid panel; Future - AST; Future - ALT; Future  5. Screening for diabetes mellitus - CBC; Future - Basic metabolic panel; Future  6. BPPV (benign paroxysmal positional vertigo), bilateral - EKG 12-Lead Rx: - meclizine (ANTIVERT) 25 MG tablet; Take 1 tablet (25 mg total) by mouth 3 (three) times daily as needed for dizziness.  Dispense: 30 tablet; Refill: 0 - increase water intake, slow position changes - reviewed with pt and gave her Imagene Sheller exercises to do at home  7. Palpitations - EKG 12-Lead - NSR @ 76bpm, no acute ST changes, no q waves - occur mainly at night when she awakens abruptly from sleep, ? OSA or just as  likely anxiety/stress - referral placed to pulm/sleep med for sleep eval. If normal/negative, will discuss possibility of anxiety causing/contributing to symptoms  This visit occurred during the SARS-CoV-2 public health emergency.  Safety protocols were in place, including screening questions prior to the visit, additional usage of staff PPE,  and extensive cleaning of exam room while observing appropriate contact time as indicated for disinfecting solutions.

## 2021-02-07 ENCOUNTER — Encounter: Payer: Self-pay | Admitting: Family Medicine

## 2021-02-13 NOTE — Addendum Note (Signed)
Addended by: Lynnea Ferrier on: 02/13/2021 10:23 AM   Modules accepted: Orders

## 2021-02-13 NOTE — Addendum Note (Signed)
Addended by: Lynnea Ferrier on: 02/13/2021 10:33 AM   Modules accepted: Orders

## 2021-02-13 NOTE — Addendum Note (Signed)
Addended by: Lynnea Ferrier on: 02/13/2021 10:22 AM   Modules accepted: Orders

## 2021-03-01 ENCOUNTER — Other Ambulatory Visit: Payer: Self-pay | Admitting: Family Medicine

## 2021-03-02 LAB — LIPID PANEL
Chol/HDL Ratio: 2.3 ratio (ref 0.0–4.4)
Cholesterol, Total: 254 mg/dL — ABNORMAL HIGH (ref 100–199)
HDL: 109 mg/dL (ref >39)
LDL Chol Calc (NIH): 134 mg/dL — ABNORMAL HIGH (ref 0–99)
Triglycerides: 68 mg/dL (ref 0–149)
VLDL Cholesterol Cal: 11 mg/dL (ref 5–40)

## 2021-03-02 LAB — BASIC METABOLIC PANEL
BUN/Creatinine Ratio: 14 (ref 9–23)
BUN: 11 mg/dL (ref 6–24)
CO2: 22 mmol/L (ref 20–29)
Calcium: 9.7 mg/dL (ref 8.7–10.2)
Chloride: 101 mmol/L (ref 96–106)
Creatinine, Ser: 0.8 mg/dL (ref 0.57–1.00)
Glucose: 95 mg/dL (ref 65–99)
Potassium: 4.2 mmol/L (ref 3.5–5.2)
Sodium: 137 mmol/L (ref 134–144)
eGFR: 89 mL/min/{1.73_m2} (ref >59)

## 2021-03-02 LAB — CBC
Hematocrit: 39.1 % (ref 34.0–46.6)
Hemoglobin: 13.5 g/dL (ref 11.1–15.9)
MCH: 30.3 pg (ref 26.6–33.0)
MCHC: 34.5 g/dL (ref 31.5–35.7)
MCV: 88 fL (ref 79–97)
Platelets: 259 10*3/uL (ref 150–450)
RBC: 4.46 x10E6/uL (ref 3.77–5.28)
RDW: 12.2 % (ref 11.7–15.4)
WBC: 5.5 10*3/uL (ref 3.4–10.8)

## 2021-03-02 LAB — ALT: ALT: 10 IU/L (ref 0–32)

## 2021-03-02 LAB — TSH: TSH: 0.499 u[IU]/mL (ref 0.450–4.500)

## 2021-03-02 LAB — IRON,TIBC AND FERRITIN PANEL
Ferritin: 257 ng/mL — ABNORMAL HIGH (ref 15–150)
Iron Saturation: 58 % — ABNORMAL HIGH (ref 15–55)
Iron: 133 ug/dL (ref 27–159)
Total Iron Binding Capacity: 230 ug/dL — ABNORMAL LOW (ref 250–450)
UIBC: 97 ug/dL — ABNORMAL LOW (ref 131–425)

## 2021-03-02 LAB — AST: AST: 17 IU/L (ref 0–40)

## 2021-03-02 LAB — T4, FREE: Free T4: 1.21 ng/dL (ref 0.82–1.77)

## 2021-03-06 ENCOUNTER — Encounter: Payer: Self-pay | Admitting: Family Medicine

## 2021-03-06 ENCOUNTER — Other Ambulatory Visit: Payer: Self-pay | Admitting: Family Medicine

## 2021-03-06 DIAGNOSIS — R7989 Other specified abnormal findings of blood chemistry: Secondary | ICD-10-CM

## 2021-03-19 NOTE — Addendum Note (Signed)
Addended by: Lynnea Ferrier on: 03/19/2021 10:51 AM   Modules accepted: Orders

## 2021-03-22 ENCOUNTER — Encounter: Payer: Self-pay | Admitting: Pulmonary Disease

## 2021-03-22 ENCOUNTER — Other Ambulatory Visit: Payer: Self-pay

## 2021-03-22 ENCOUNTER — Ambulatory Visit (INDEPENDENT_AMBULATORY_CARE_PROVIDER_SITE_OTHER): Payer: 59 | Admitting: Pulmonary Disease

## 2021-03-22 VITALS — BP 118/78 | HR 66 | Ht 71.0 in | Wt 169.0 lb

## 2021-03-22 DIAGNOSIS — R0683 Snoring: Secondary | ICD-10-CM | POA: Diagnosis not present

## 2021-03-22 NOTE — Patient Instructions (Signed)
Will arrange for home sleep study Will call to arrange for follow up after sleep study reviewed  

## 2021-03-22 NOTE — Progress Notes (Signed)
[facility]  Chief Complaint  Patient presents with  . Consult  . Snoring    Waking up at night    Constitutional:  BP 118/78   Pulse 66   Ht 5\' 11"  (1.803 m)   Wt 169 lb (76.7 kg)   LMP 05/28/2019   SpO2 100%   BMI 23.57 kg/m   Past Medical History:  Multinodular goiter, Restless leg syndrome, Benign positional vertigo  Past Surgical History:  She  has a past surgical history that includes Tubal ligation; Inguinal hernia repair (2005); and Cesarean section (2005).  Brief Summary:  Felicia Campos is a 50 y.o. female with snoring.      Subjective:   She works as a travel Marine scientist.  She thinks she got COVID while working in the hospital about 1 year ago.  She has noticed more trouble with sleep issues since.  She feels sleepy all the time.  She snores and wakes up feeling like her throat is closing.    She goes to sleep at 10 pm.  She falls asleep in few minutes.  She wakes up 3 to 4 times to use the bathroom.  She gets out of bed at 8 am.  She feels tired in the morning.  She denies morning headache.  She has tried melatonin to help sleep.  She drinks 2 cups of coffee in the morning and 1 in the afternoon.  She has history of restless legs, and this flares up whenever she exerts herself too much.  She was told she has a goiter a couple of years ago.  This has been biopsied several times.  She doesn't have dyspnea with activity, cough, stridor, or wheezing.  She denies sleep walking, sleep talking, bruxism, or nightmares.  There is no history of restless legs.  She denies sleep hallucinations, sleep paralysis, or cataplexy.  The Epworth score is 0 out of 24.    Physical Exam:   Appearance - well kempt   ENMT - no sinus tenderness, no oral exudate, no LAN, Mallampati 4 airway, no stridor, scalloped tongue, low laying soft palate, narrow nasal angles, goiter  Respiratory - equal breath sounds bilaterally, no wheezing or rales  CV  - s1s2 regular rate and rhythm, no murmurs  Ext - no clubbing, no edema  Skin - no rashes  Psych - normal mood and affect   Sleep Tests:     Social History:  She  reports that she has never smoked. She has never used smokeless tobacco. She reports current alcohol use of about 7.0 standard drinks of alcohol per week. She reports that she does not use drugs.  Family History:  Her family history includes Cancer in her paternal grandmother; Heart disease in her maternal grandmother; High Cholesterol in her father; Stroke in her mother.    Discussion:  She has snoring, sleep disruption, apnea, and daytime sleepiness.  She reports her symptoms began around the time she might have had COVID 19 infection.  She also has a multinodular goiter.  It is possible that she could have obstructive sleep apnea.  Assessment/Plan:   Snoring with excessive daytime sleepiness. - will need to arrange for a home sleep study  Multinodular goiter. - explained how thyroid disease could contribute to risk of developing sleep apnea, but her most recent thyroid labs were normal - explained that she would likely have more respiratory symptoms if her goiter was large enough to cause extrinsic compression that would compromise  her airway - if she develops more respiratory symptoms, then could consider doing PFT with particular attention to flow-volume loop pattern and CT neck and upper chest; don't think these are required at present  Obesity. - discussed how weight can impact sleep and risk for sleep disordered breathing - discussed options to assist with weight loss: combination of diet modification, cardiovascular and strength training exercises  Cardiovascular risk. - had an extensive discussion regarding the adverse health consequences related to untreated sleep disordered breathing - specifically discussed the risks for hypertension, coronary artery disease, cardiac dysrhythmias, cerebrovascular disease,  and diabetes - lifestyle modification discussed  Safe driving practices. - discussed how sleep disruption can increase risk of accidents, particularly when driving - safe driving practices were discussed  Therapies for obstructive sleep apnea. - if the sleep study shows significant sleep apnea, then various therapies for treatment were reviewed: CPAP, oral appliance, and surgical interventions   Time Spent Involved in Patient Care on Day of Examination:  32 minutes  Follow up:  Patient Instructions  Will arrange for home sleep study Will call to arrange for follow up after sleep study reviewed    Medication List:   Allergies as of 03/22/2021   No Known Allergies     Medication List       Accurate as of Mar 22, 2021 10:16 AM. If you have any questions, ask your nurse or doctor.        STOP taking these medications   meclizine 25 MG tablet Commonly known as: ANTIVERT Stopped by: Chesley Mires, MD     TAKE these medications   loratadine 10 MG tablet Commonly known as: CLARITIN Take by mouth.       Signature:  Chesley Mires, MD [place] Pager - [phone] 03/22/2021, 10:16 AM

## 2021-03-25 LAB — TRANSFERRIN SATURATION
IRON SATN MFR SERPL: 43 % Saturation
IRON SERPL-MCNC: 108 ug/dL
TRANSFERRIN SERPL-MCNC: 179 mg/dL — ABNORMAL LOW

## 2021-03-25 LAB — HEMOCHROMATOSIS DNA-PCR(C282Y,H63D)

## 2021-03-28 ENCOUNTER — Encounter: Payer: Self-pay | Admitting: Family Medicine

## 2021-03-28 DIAGNOSIS — R7989 Other specified abnormal findings of blood chemistry: Secondary | ICD-10-CM

## 2021-04-03 ENCOUNTER — Encounter: Payer: No Typology Code available for payment source | Admitting: Family Medicine

## 2021-04-22 ENCOUNTER — Other Ambulatory Visit: Payer: Self-pay

## 2021-04-22 ENCOUNTER — Ambulatory Visit: Payer: 59

## 2021-04-22 DIAGNOSIS — G4733 Obstructive sleep apnea (adult) (pediatric): Secondary | ICD-10-CM | POA: Diagnosis not present

## 2021-04-22 DIAGNOSIS — R0683 Snoring: Secondary | ICD-10-CM

## 2021-04-24 ENCOUNTER — Telehealth: Payer: Self-pay | Admitting: Pulmonary Disease

## 2021-04-24 ENCOUNTER — Other Ambulatory Visit: Payer: Self-pay | Admitting: Family

## 2021-04-24 DIAGNOSIS — G4733 Obstructive sleep apnea (adult) (pediatric): Secondary | ICD-10-CM | POA: Diagnosis not present

## 2021-04-24 NOTE — Telephone Encounter (Signed)
HST 04/22/21 >> AHI 7.2, SpO2 low 88%   Please inform her that her sleep study shows mild obstructive sleep apnea.  Please arrange for ROV with me or NP to discuss treatment options.

## 2021-04-24 NOTE — Telephone Encounter (Signed)
Called and went over HST results per Dr Halford Chessman with patient. All questions answered and patient expressed full understanding. Scheduled televisit, per patient request with NP on Friday 04/26/21 at 11:30am. Telephone number confirmed to use for appointment. Patient agreeable with time and date. Nothing further needed at this time.

## 2021-04-26 ENCOUNTER — Inpatient Hospital Stay (HOSPITAL_BASED_OUTPATIENT_CLINIC_OR_DEPARTMENT_OTHER): Payer: 59 | Admitting: Family

## 2021-04-26 ENCOUNTER — Inpatient Hospital Stay: Payer: 59 | Attending: Family

## 2021-04-26 ENCOUNTER — Encounter: Payer: Self-pay | Admitting: Primary Care

## 2021-04-26 ENCOUNTER — Other Ambulatory Visit: Payer: Self-pay

## 2021-04-26 ENCOUNTER — Encounter: Payer: Self-pay | Admitting: Family

## 2021-04-26 ENCOUNTER — Ambulatory Visit (INDEPENDENT_AMBULATORY_CARE_PROVIDER_SITE_OTHER): Payer: 59 | Admitting: Primary Care

## 2021-04-26 DIAGNOSIS — G2581 Restless legs syndrome: Secondary | ICD-10-CM | POA: Diagnosis not present

## 2021-04-26 DIAGNOSIS — G4733 Obstructive sleep apnea (adult) (pediatric): Secondary | ICD-10-CM | POA: Diagnosis not present

## 2021-04-26 DIAGNOSIS — G4730 Sleep apnea, unspecified: Secondary | ICD-10-CM | POA: Insufficient documentation

## 2021-04-26 LAB — CMP (CANCER CENTER ONLY)
ALT: 10 U/L (ref 0–44)
AST: 15 U/L (ref 15–41)
Albumin: 4.6 g/dL (ref 3.5–5.0)
Alkaline Phosphatase: 48 U/L (ref 38–126)
Anion gap: 8 (ref 5–15)
BUN: 13 mg/dL (ref 6–20)
CO2: 29 mmol/L (ref 22–32)
Calcium: 9.8 mg/dL (ref 8.9–10.3)
Chloride: 101 mmol/L (ref 98–111)
Creatinine: 0.84 mg/dL (ref 0.44–1.00)
GFR, Estimated: >60 mL/min (ref >60)
Glucose, Bld: 90 mg/dL (ref 70–99)
Potassium: 4.3 mmol/L (ref 3.5–5.1)
Sodium: 138 mmol/L (ref 135–145)
Total Bilirubin: 0.6 mg/dL (ref 0.3–1.2)
Total Protein: 6.7 g/dL (ref 6.5–8.1)

## 2021-04-26 LAB — IRON AND TIBC
Iron: 87 ug/dL (ref 41–142)
Saturation Ratios: 37 % (ref 21–57)
TIBC: 236 ug/dL (ref 236–444)
UIBC: 149 ug/dL (ref 120–384)

## 2021-04-26 LAB — CBC WITH DIFFERENTIAL (CANCER CENTER ONLY)
Abs Immature Granulocytes: 0.01 10*3/uL (ref 0.00–0.07)
Basophils Absolute: 0 10*3/uL (ref 0.0–0.1)
Basophils Relative: 1 %
Eosinophils Absolute: 0.1 10*3/uL (ref 0.0–0.5)
Eosinophils Relative: 3 %
HCT: 40.4 % (ref 36.0–46.0)
Hemoglobin: 13.7 g/dL (ref 12.0–15.0)
Immature Granulocytes: 0 %
Lymphocytes Relative: 43 %
Lymphs Abs: 2.4 10*3/uL (ref 0.7–4.0)
MCH: 30.4 pg (ref 26.0–34.0)
MCHC: 33.9 g/dL (ref 30.0–36.0)
MCV: 89.6 fL (ref 80.0–100.0)
Monocytes Absolute: 0.4 10*3/uL (ref 0.1–1.0)
Monocytes Relative: 7 %
Neutro Abs: 2.6 10*3/uL (ref 1.7–7.7)
Neutrophils Relative %: 46 %
Platelet Count: 219 10*3/uL (ref 150–400)
RBC: 4.51 MIL/uL (ref 3.87–5.11)
RDW: 11.9 % (ref 11.5–15.5)
WBC Count: 5.7 10*3/uL (ref 4.0–10.5)
nRBC: 0 % (ref 0.0–0.2)

## 2021-04-26 LAB — FERRITIN: Ferritin: 231 ng/mL (ref 11–307)

## 2021-04-26 NOTE — Progress Notes (Signed)
Virtual Visit via Telephone Note  I connected with Felicia Campos on 04/26/21 at 11:30 AM EDT by telephone and verified that I am speaking with the correct person using two identifiers.  Location: Patient: Home Provider: Office    I discussed the limitations, risks, security and privacy concerns of performing an evaluation and management service by telephone and the availability of in person appointments. I also discussed with the patient that there may be a patient responsible charge related to this service. The patient expressed understanding and agreed to proceed.   History of Present Illness:  50 year old female, never smoked. PMH significant for mild OSA. Patient of Dr. Halford Chessman, seen for initial consult in April 2022 for sleep consult d/t snoring.   04/26/2021 Patient contacted today for televisit to review sleep study results. She reports symptoms of daytime sleepiness, snoring and feels like her throat is closing.  She tells me that she has a diagnosis of hemochromatosis causing a thyroid goiter. HST showed mild OSA. We discussed treatment options including weight loss, oral appliance, CPAP therapy or referral to ENT for possible surgical options. Her weight is within a normal range, BMI 23. She is interested in oral appliance.   Observations/Objective:  Able to speak in full sentences; no overt shortness of breath, wheezing or cough   Assessment and Plan:  Mild OSA: - HST >> AHI 7.2/hr, SpO2 low 82% (average 95%) - We reviewed her sleep study results and discussed treatment options.  - She is interested in referral to orthodontics for oral appliance  - Encouraged patient to maintain a healthy weight, recommend side sleeping position and advised against driving if experiencing excessive daytime fatigue or somnolence   Follow Up Instructions:  - 6 months with Dr. Halford Chessman    I discussed the assessment and treatment plan with the patient. The patient was provided an opportunity to ask  questions and all were answered. The patient agreed with the plan and demonstrated an understanding of the instructions.   The patient was advised to call back or seek an in-person evaluation if the symptoms worsen or if the condition fails to improve as anticipated.  I provided 22 minutes of non-face-to-face time during this encounter.   Martyn Ehrich, NP

## 2021-04-26 NOTE — Patient Instructions (Addendum)
Home sleep test showed mild obstructive sleep apnea, you had on average 7.2 events an hour with SpO2 low 82% (average 95%)  Referral: Dr. Augustina Mood- re: mild OSA [phone]  Follow-up: 6 months with Dr. Halford Chessman or APP

## 2021-04-26 NOTE — Progress Notes (Signed)
Hematology/Oncology Consultation   Name: Felicia Campos      MRN: [id number]    Location: Room/bed info not found  Date: 04/26/2021 Time:8:52 AM   REFERRING PHYSICIAN: Letta Median, DO  REASON FOR CONSULT: Elevated ferritin level   DIAGNOSIS: Hemochromatosis, heterozygous for the H63D mutation  HISTORY OF PRESENT ILLNESS: Ms. Felicia Campos is a very pleasant 50 yo caucasian female with recent diagnosis of hemochromatosis.  Her iron saturation in April was 58% and ferritin 257. Today's results are pending.  She has previous history of iron deficiency.  She states that she is postmenopausal and has not had a cycle in 2 years.  There is no known family history of hemochromatosis.  No personal history of thrombotic event, heart or liver disease.  She has 2 children and history of 2 miscarriages one of them being a tubal pregnancy.  She has goiter on her thyroid and states that this is monitored regularly with [place] and biopsies have been negative. She has been diagnosed with sleep apnea and feels that the goiter is part of the problem.  She has rosacea effecting her face, neck and chest and has had this lasered in the past.  No history of diabetes.  Her father has history of heart disease.  Mother has history of stroke and Raynaud's.  No fever, chills, n/v, cough, rash, dizziness, SOB, chest pain, palpitations, abdominal pain or changes in bowel or bladder habits.  No swelling or tenderness in her extremities.  She has intermittent numbness and tingling in her hands and feet. This comes and goes.  Pedal pulses are 3+.  No falls or syncope to report.  She has maintained a good appetite but recently cut out most meat. She stays well hydrated. Her weight is stable at 169 lbs. No smoking or recreational drug use.  She does enjoy 1-2 glasses of wine each evening.  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 She is quite active and enjoys hiking and being  outdoors.   ROS: All other 10 point review of systems is negative.   PAST MEDICAL HISTORY:   Past Medical History:  Diagnosis Date   Benign positional vertigo    Multinodular goiter    Restless leg syndrome     ALLERGIES: No Known Allergies    MEDICATIONS:  Current Outpatient Medications on File Prior to Visit  Medication Sig Dispense Refill   loratadine (CLARITIN) 10 MG tablet Take by mouth.     No current facility-administered medications on file prior to visit.     PAST SURGICAL HISTORY Past Surgical History:  Procedure Laterality Date   CESAREAN SECTION  2005   INGUINAL HERNIA REPAIR  2005   TUBAL LIGATION      FAMILY HISTORY: Family History  Problem Relation Age of Onset   Stroke Mother        45   High Cholesterol Father    Heart disease Maternal Grandmother    Cancer Paternal Grandmother        breast cancer   Diabetes Neg Hx    Thyroid disease Neg Hx     SOCIAL HISTORY:  reports that she has never smoked. She has never used smokeless tobacco. She reports current alcohol use of about 7.0 standard drinks of alcohol per week. She reports that she does not use drugs.  PERFORMANCE STATUS: The patient's performance status is 0 - Asymptomatic  PHYSICAL EXAM: Most Recent Vital Signs: Last menstrual period  05/28/2019. BP (!) 137/96 (BP Location: Left Arm, Patient Position: Sitting)   Pulse 72   Temp 98 F (36.7 C) (Oral)   Resp 18   Wt 169 lb (76.7 kg)   LMP 05/28/2019   SpO2 100%   BMI 23.57 kg/m   General Appearance:    Alert, cooperative, no distress, appears stated age  Head:    Normocephalic, without obvious abnormality, atraumatic  Eyes:    PERRL, conjunctiva/corneas clear, EOM's intact, fundi    benign, both eyes        Throat:   Lips, mucosa, and tongue normal; teeth and gums normal  Neck:   Supple, symmetrical, trachea midline, no adenopathy;    thyroid:  no enlargement/tenderness/nodules; no carotid   bruit or JVD  Back:     Symmetric,  no curvature, ROM normal, no CVA tenderness  Lungs:     Clear to auscultation bilaterally, respirations unlabored  Chest Wall:    No tenderness or deformity   Heart:    Regular rate and rhythm, S1 and S2 normal, no murmur, rub   or gallop     Abdomen:     Soft, non-tender, bowel sounds active all four quadrants,    no masses, no organomegaly        Extremities:   Extremities normal, atraumatic, no cyanosis or edema  Pulses:   2+ and symmetric all extremities  Skin:   Skin color, texture, turgor normal, no rashes or lesions  Lymph nodes:   Cervical, supraclavicular, and axillary nodes normal  Neurologic:   CNII-XII intact, normal strength, sensation and reflexes    throughout    LABORATORY DATA:  Results for orders placed or performed in visit on 04/26/21 (from the past 48 hour(s))  CBC with Differential (Cancer Center Only)     Status: None   Collection Time: 04/26/21  8:36 AM  Result Value Ref Range   WBC Count 5.7 4.0 - 10.5 K/uL   RBC 4.51 3.87 - 5.11 MIL/uL   Hemoglobin 13.7 12.0 - 15.0 g/dL   HCT 40.4 36.0 - 46.0 %   MCV 89.6 80.0 - 100.0 fL   MCH 30.4 26.0 - 34.0 pg   MCHC 33.9 30.0 - 36.0 g/dL   RDW 11.9 11.5 - 15.5 %   Platelet Count 219 150 - 400 K/uL   nRBC 0.0 0.0 - 0.2 %   Neutrophils Relative % 46 %   Neutro Abs 2.6 1.7 - 7.7 K/uL   Lymphocytes Relative 43 %   Lymphs Abs 2.4 0.7 - 4.0 K/uL   Monocytes Relative 7 %   Monocytes Absolute 0.4 0.1 - 1.0 K/uL   Eosinophils Relative 3 %   Eosinophils Absolute 0.1 0.0 - 0.5 K/uL   Basophils Relative 1 %   Basophils Absolute 0.0 0.0 - 0.1 K/uL   Immature Granulocytes 0 %   Abs Immature Granulocytes 0.01 0.00 - 0.07 K/uL    Comment: Performed at [facility] at [facility], [address] [place], [place] [address]      RADIOGRAPHY: No results found.     PATHOLOGY: None  ASSESSMENT/PLAN: Ms. Felicia Campos is a very pleasant 50 yo caucasian female with recent diagnosis of hemochromatosis,  heterozygous for the H63D mutation.  Iron studies are pending. She is interest in donating with [facility] if needed which we can certainly arrange.  We will do an abdominal US is she develops any kind of abdominal pain or bloating.  Lab check monthly (  phlebotomy if needed) and follow-up in 2 months.    All questions were answered. The patient knows to call the clinic with any problems, questions or concerns. We can certainly see the patient much sooner if necessary.   Laverna Peace, NP

## 2021-04-29 ENCOUNTER — Telehealth: Payer: Self-pay

## 2021-04-29 ENCOUNTER — Telehealth: Payer: Self-pay | Admitting: Primary Care

## 2021-04-29 NOTE — Telephone Encounter (Signed)
No 04/26/21 LOS noted   Felicia Campos

## 2021-04-29 NOTE — Telephone Encounter (Signed)
She will let us know if she developed any respiratory complaints, if so will proceed with further evaluation

## 2021-04-29 NOTE — Telephone Encounter (Signed)
-----   Message from Chesley Mires, MD sent at 04/26/2021 12:23 PM EDT ----- Regarding: RE: Goiter - OSA If she is really nervous about the goiter, then I told her we could order a CT neck and chest to further assess in addition to getting spirometry to see if flow volume loop shows extrinsic compression pattern.  Vineet  ----- Message ----- From: Martyn Ehrich, NP Sent: 04/26/2021  12:07 PM EDT To: Chesley Mires, MD Subject: Goiter - OSA                                   Patient of yours, HST showed mild OSA. I am going to refer to orthodontist for oral appliance.  She wanted to let you know that she was dx with hemochromatosis, concerned goiter could be causing her apnea. I saw in your note unless she had respiratory symptoms this is unlikely   -Corporate treasurer

## 2021-05-27 ENCOUNTER — Inpatient Hospital Stay: Payer: 59 | Attending: Family

## 2021-05-27 ENCOUNTER — Other Ambulatory Visit: Payer: Self-pay

## 2021-05-27 LAB — CBC WITH DIFFERENTIAL (CANCER CENTER ONLY)
Abs Immature Granulocytes: 0.01 10*3/uL (ref 0.00–0.07)
Basophils Absolute: 0 10*3/uL (ref 0.0–0.1)
Basophils Relative: 0 %
Eosinophils Absolute: 0.2 10*3/uL (ref 0.0–0.5)
Eosinophils Relative: 3 %
HCT: 41.3 % (ref 36.0–46.0)
Hemoglobin: 13.9 g/dL (ref 12.0–15.0)
Immature Granulocytes: 0 %
Lymphocytes Relative: 36 %
Lymphs Abs: 1.9 10*3/uL (ref 0.7–4.0)
MCH: 30.4 pg (ref 26.0–34.0)
MCHC: 33.7 g/dL (ref 30.0–36.0)
MCV: 90.4 fL (ref 80.0–100.0)
Monocytes Absolute: 0.4 10*3/uL (ref 0.1–1.0)
Monocytes Relative: 7 %
Neutro Abs: 2.9 10*3/uL (ref 1.7–7.7)
Neutrophils Relative %: 54 %
Platelet Count: 220 10*3/uL (ref 150–400)
RBC: 4.57 MIL/uL (ref 3.87–5.11)
RDW: 11.7 % (ref 11.5–15.5)
WBC Count: 5.4 10*3/uL (ref 4.0–10.5)
nRBC: 0 % (ref 0.0–0.2)

## 2021-05-27 LAB — CMP (CANCER CENTER ONLY)
ALT: 12 U/L (ref 0–44)
AST: 17 U/L (ref 15–41)
Albumin: 4.5 g/dL (ref 3.5–5.0)
Alkaline Phosphatase: 68 U/L (ref 38–126)
Anion gap: 8 (ref 5–15)
BUN: 18 mg/dL (ref 6–20)
CO2: 29 mmol/L (ref 22–32)
Calcium: 9.5 mg/dL (ref 8.9–10.3)
Chloride: 102 mmol/L (ref 98–111)
Creatinine: 0.98 mg/dL (ref 0.44–1.00)
GFR, Estimated: >60 mL/min (ref >60)
Glucose, Bld: 92 mg/dL (ref 70–99)
Potassium: 4.2 mmol/L (ref 3.5–5.1)
Sodium: 139 mmol/L (ref 135–145)
Total Bilirubin: 0.6 mg/dL (ref 0.3–1.2)
Total Protein: 6.6 g/dL (ref 6.5–8.1)

## 2021-05-28 ENCOUNTER — Encounter: Payer: Self-pay | Admitting: *Deleted

## 2021-05-28 LAB — IRON AND TIBC
Iron: 113 ug/dL (ref 41–142)
Saturation Ratios: 51 % (ref 21–57)
TIBC: 224 ug/dL — ABNORMAL LOW (ref 236–444)
UIBC: 110 ug/dL — ABNORMAL LOW (ref 120–384)

## 2021-05-28 LAB — FERRITIN: Ferritin: 204 ng/mL (ref 11–307)

## 2021-06-11 ENCOUNTER — Encounter: Payer: Self-pay | Admitting: Family

## 2021-06-18 ENCOUNTER — Ambulatory Visit (INDEPENDENT_AMBULATORY_CARE_PROVIDER_SITE_OTHER): Payer: 59

## 2021-06-18 ENCOUNTER — Encounter: Payer: Self-pay | Admitting: Family Medicine

## 2021-06-18 ENCOUNTER — Ambulatory Visit (INDEPENDENT_AMBULATORY_CARE_PROVIDER_SITE_OTHER): Payer: 59 | Admitting: Family Medicine

## 2021-06-18 ENCOUNTER — Other Ambulatory Visit: Payer: Self-pay

## 2021-06-18 VITALS — BP 130/81 | HR 70 | Temp 97.3°F | Resp 20 | Ht 71.0 in | Wt 172.0 lb

## 2021-06-18 DIAGNOSIS — N951 Menopausal and female climacteric states: Secondary | ICD-10-CM

## 2021-06-18 DIAGNOSIS — D492 Neoplasm of unspecified behavior of bone, soft tissue, and skin: Secondary | ICD-10-CM

## 2021-06-18 DIAGNOSIS — H8113 Benign paroxysmal vertigo, bilateral: Secondary | ICD-10-CM

## 2021-06-18 DIAGNOSIS — E042 Nontoxic multinodular goiter: Secondary | ICD-10-CM

## 2021-06-18 DIAGNOSIS — Z7689 Persons encountering health services in other specified circumstances: Secondary | ICD-10-CM

## 2021-06-18 DIAGNOSIS — R6882 Decreased libido: Secondary | ICD-10-CM

## 2021-06-18 MED ORDER — ESTRADIOL 0.1 MG/GM VA CREA: 1.0000 | TOPICAL_CREAM | VAGINAL | 12 refills | Status: DC

## 2021-06-18 NOTE — Patient Instructions (Signed)
How to Perform the Epley Maneuver The Epley maneuver is an exercise that relieves symptoms of vertigo. Vertigo is the feeling that you or your surroundings are moving when they are not. When you feel vertigo, you may feel like the room is spinning and may have trouble walking. The Epley maneuver is used for a type of vertigo caused by a calcium deposit in a part of the inner ear. The maneuver involves changing headpositions to help the deposit move out of the area. You can do this maneuver at home whenever you have symptoms of vertigo. You canrepeat it in 24 hours if your vertigo has not gone away. Even though the Epley maneuver may relieve your vertigo for a few weeks, it is possible that your symptoms will return. This maneuver relieves vertigo, but itdoes not relieve dizziness. What are the risks? If it is done correctly, the Epley maneuver is considered safe. Sometimes it can lead to dizziness or nausea that goes away after a short time. If you develop other symptoms--such as changes in vision, weakness, or numbness--stopdoing the maneuver and call your health care provider. Supplies needed: A bed or table. A pillow. How to do the Epley maneuver     Sit on the edge of a bed or table with your back straight and your legs extended or hanging over the edge of the bed or table. Turn your head halfway toward the affected ear or side as told by your health care provider. Lie backward quickly with your head turned until you are lying flat on your back. Your head should dangle (head-hanging position). You may want to position a pillow under your shoulders. Hold this position for at least 30 seconds. If you feel dizzy or have symptoms of vertigo, continue to hold the position until the symptoms stop. Turn your head to the opposite direction until your unaffected ear is facing down. Your head should continue to dangle. Hold this position for at least 30 seconds. If you feel dizzy or have symptoms of  vertigo, continue to hold the position until the symptoms stop. Turn your whole body to the same side as your head so that you are positioned on your side. Your head will now be nearly facedown and no longer needs to dangle. Hold for at least 30 seconds. If you feel dizzy or have symptoms of vertigo, continue to hold the position until the symptoms stop. Sit back up. You can repeat the maneuver in 24 hours if your vertigo does not go away. Follow these instructions at home: For 24 hours after doing the Epley maneuver: Keep your head in an upright position. When lying down to sleep or rest, keep your head raised (elevated) with two or more pillows. Avoid excessive neck movements. Activity Do not drive or use machinery if you feel dizzy. After doing the Epley maneuver, return to your normal activities as told by your health care provider. Ask your health care provider what activities are safe for you. General instructions Drink enough fluid to keep your urine pale yellow. Do not drink alcohol. Take over-the-counter and prescription medicines only as told by your health care provider. Keep all follow-up visits. This is important. Preventing vertigo symptoms Ask your health care provider if there is anything you should do at home to prevent vertigo. He or she may recommend that you: Keep your head elevated with two or more pillows while you sleep. Do not sleep on the side of your affected ear. Get up slowly from bed.   Avoid sudden movements during the day. Avoid extreme head positions or movement, such as looking up or bending over. Contact a health care provider if: Your vertigo gets worse. You have other symptoms, including: Nausea. Vomiting. Headache. Get help right away if you: Have vision changes. Have a headache or neck pain that is severe or getting worse. Cannot stop vomiting. Have new numbness or weakness in any part of your body. These symptoms may represent a serious problem  that is an emergency. Do not wait to see if the symptoms will go away. Get medical help right away. Call your local emergency services (911 in the [place] Do not drive yourself to the hospital. Summary Vertigo is the feeling that you or your surroundings are moving when they are not. The Epley maneuver is an exercise that relieves symptoms of vertigo. If the Epley maneuver is done correctly, it is considered safe. This information is not intended to replace advice given to you by your health care provider. Make sure you discuss any questions you have with your healthcare provider. Document Revised: 10/03/2020 Document Reviewed: 10/03/2020 Elsevier Patient Education  2022 Elsevier Inc.  

## 2021-06-18 NOTE — Progress Notes (Signed)
New Patient Office Visit  Subjective:  Patient ID: Felicia Campos, female    DOB: 08/06/1971  Age: 50 y.o. MRN: [id number]  CC:  Chief Complaint  Patient presents with   Establish Care    HPI Felicia Campos presents to establish care. Her previous PCP left the practice she was at.   She has a few concerns today.   Lump on collarbone Felicia Campos reports a small lump that feels bony. She reports it has been present for a few months. No tenderness, erythema, or swelling. Denies injury. No fevers. She sees endo for a goiter. Denies changes or tenderness in goiter. She has hemochromatosis and wonders if this is due to this.   2. Low libido This has been progressively getting worse for years since menopause. She reports starting menopause 3 years ago. She reports vaginal dryness. She also has difficulty reaching orgasm and low desire. She has not tried any remedies for this. Her last pap with co-testing was in 2018.   3. HLD Reports that she had her cholesterol check in April of 2022 and that is was normal.  4. BBPV She has been diagnosed with BBPV. Reports intermittent dizziness. Denies weakness, nausea, or vomiting. Her previous PCP discussed maneuvers she can do at home for this, but she cannot remember how to do this. She has tried meclizine but reports that made her too drowsy.   Depression screen [facility] 2/9 06/18/2021 01/23/2021 07/29/2019  Decreased Interest 0 0 0  Down, Depressed, Hopeless 0 0 0  PHQ - 2 Score 0 0 0  Altered sleeping 0 - -  Tired, decreased energy 0 - -  Change in appetite 0 - -  Feeling bad or failure about yourself  0 - -  Trouble concentrating 0 - -  Moving slowly or fidgety/restless 0 - -  Suicidal thoughts 0 - -  PHQ-9 Score 0 - -     Past Medical History:  Diagnosis Date   Benign positional vertigo    Hemochromatosis    Multinodular goiter    Restless leg syndrome     Past Surgical History:  Procedure Laterality Date   CESAREAN SECTION  2005   INGUINAL  HERNIA REPAIR  2005   TUBAL LIGATION      Family History  Problem Relation Age of Onset   Stroke Mother        35   Heart disease Father    Hyperlipidemia Father    High Cholesterol Father    Hypertension Sister    Heart disease Maternal Grandmother    Cancer Paternal Grandmother        breast cancer   Diabetes Neg Hx    Thyroid disease Neg Hx     Social History   Socioeconomic History   Marital status: Married    Spouse name: Not on file   Number of children: Not on file   Years of education: Not on file   Highest education level: Not on file  Occupational History   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      Regular exercise: walk; 20 min daily   Caffeine use: 1 soda daily   Social Determinants of Health   Financial Resource Strain: Not on file  Food Insecurity: Not on file  Transportation Needs: Not on file  Physical Activity: Not on file  Stress: Not on file  Social Connections: Not on file  Intimate Partner Violence: Not on file    ROS Review of Systems As per HPI.   Objective:   Today's Vitals: BP 130/81   Pulse 70   Temp (!) 97.3 F (36.3 C) (Temporal)   Resp 20   Ht '5\' 11"'$  (1.803 m)   Wt 172 lb (78 kg)   LMP 05/28/2019   SpO2 98%   BMI 23.99 kg/m   Physical Exam Vitals and nursing note reviewed.  Constitutional:      General: She is not in acute distress.    Appearance: She is not ill-appearing, toxic-appearing or diaphoretic.  Neck:      Comments: Goiter present. No tenderness. Right medial sternal border of clavicle is more pronounced than left side. No fluctuance, erythema, tenderness, or swelling.  Cardiovascular:     Rate and Rhythm: Normal rate and regular rhythm.     Heart sounds: Normal heart sounds. No murmur heard. Pulmonary:     Effort: Pulmonary effort is normal. No respiratory distress.     Breath sounds: Normal breath sounds.  Musculoskeletal:     Right lower leg: No edema.     Left lower leg: No edema.  Skin:    General: Skin is warm and dry.  Neurological:     General: No focal deficit present.     Mental Status: She is alert and oriented to person, place, and time.  Psychiatric:        Mood and Affect: Mood normal.        Behavior: Behavior normal.    Assessment & Plan:   Jawaher was seen today for establish care.  Diagnoses and all orders for this visit:  Abnormal growth of collarbone Xray today in office, radiology report pending. ? Accumulation of iron.  -     DG Clavicle Right; Future  Multinodular goiter Managed by endo. Has had biopsy in past. TSH 4 months ago was stable. Denies changes or tenderness.   Hereditary hemochromatosis ([place]) Managed by hematology.   BPPV (benign paroxysmal positional vertigo), bilateral Intermittent. Handout given with epley maneuver. Discussed PT if needed.   Low libido Menopausal vaginal dryness Discussed scream cream through [employer], however this is on backorder and has been for some time. Try estrace vaginal cream as below.  -     estradiol (ESTRACE) 0.1 MG/GM vaginal cream; Place 1 Applicatorful vaginally 3 (three) times a week.  Encounter to establish care Reviewed available records.    Follow-up: Return in about 6 months (around 12/19/2021) for chronic follow up.   The patient indicates understanding of these issues and agrees with the plan.  Gwenlyn Perking, FNP

## 2021-06-26 ENCOUNTER — Other Ambulatory Visit: Payer: Self-pay

## 2021-06-26 ENCOUNTER — Inpatient Hospital Stay: Payer: 59 | Admitting: Family

## 2021-06-26 ENCOUNTER — Inpatient Hospital Stay: Payer: 59 | Attending: Family

## 2021-06-26 LAB — CBC WITH DIFFERENTIAL (CANCER CENTER ONLY)
Abs Immature Granulocytes: 0.01 10*3/uL (ref 0.00–0.07)
Basophils Absolute: 0 10*3/uL (ref 0.0–0.1)
Basophils Relative: 1 %
Eosinophils Absolute: 0.2 10*3/uL (ref 0.0–0.5)
Eosinophils Relative: 3 %
HCT: 37.5 % (ref 36.0–46.0)
Hemoglobin: 12.6 g/dL (ref 12.0–15.0)
Immature Granulocytes: 0 %
Lymphocytes Relative: 37 %
Lymphs Abs: 1.9 10*3/uL (ref 0.7–4.0)
MCH: 30.4 pg (ref 26.0–34.0)
MCHC: 33.6 g/dL (ref 30.0–36.0)
MCV: 90.4 fL (ref 80.0–100.0)
Monocytes Absolute: 0.3 10*3/uL (ref 0.1–1.0)
Monocytes Relative: 6 %
Neutro Abs: 2.7 10*3/uL (ref 1.7–7.7)
Neutrophils Relative %: 53 %
Platelet Count: 227 10*3/uL (ref 150–400)
RBC: 4.15 MIL/uL (ref 3.87–5.11)
RDW: 12.3 % (ref 11.5–15.5)
WBC Count: 5.1 10*3/uL (ref 4.0–10.5)
nRBC: 0 % (ref 0.0–0.2)

## 2021-06-26 LAB — IRON AND TIBC
Iron: 52 ug/dL (ref 41–142)
Saturation Ratios: 21 % (ref 21–57)
TIBC: 246 ug/dL (ref 236–444)
UIBC: 194 ug/dL (ref 120–384)

## 2021-06-26 LAB — CMP (CANCER CENTER ONLY)
ALT: 10 U/L (ref 0–44)
AST: 14 U/L — ABNORMAL LOW (ref 15–41)
Albumin: 4.3 g/dL (ref 3.5–5.0)
Alkaline Phosphatase: 61 U/L (ref 38–126)
Anion gap: 9 (ref 5–15)
BUN: 18 mg/dL (ref 6–20)
CO2: 26 mmol/L (ref 22–32)
Calcium: 9.3 mg/dL (ref 8.9–10.3)
Chloride: 103 mmol/L (ref 98–111)
Creatinine: 0.91 mg/dL (ref 0.44–1.00)
GFR, Estimated: >60 mL/min (ref >60)
Glucose, Bld: 104 mg/dL — ABNORMAL HIGH (ref 70–99)
Potassium: 4.6 mmol/L (ref 3.5–5.1)
Sodium: 138 mmol/L (ref 135–145)
Total Bilirubin: 0.3 mg/dL (ref 0.3–1.2)
Total Protein: 6.4 g/dL — ABNORMAL LOW (ref 6.5–8.1)

## 2021-06-26 LAB — FERRITIN: Ferritin: 64 ng/mL (ref 11–307)

## 2021-06-28 IMAGING — US US THYROID
1 series · 13 of 25 positions shown · non-contrast
Comparison: August 16, 2013,August 03, 2013

CLINICAL DATA: 47-year-old female with a history of thyroid goiter
and a prior alcohol ablation 0108

EXAM:
THYROID ULTRASOUND
TECHNIQUE: Ultrasound examination of the thyroid gland and adjacent soft
tissues was performed.

[Series 1: us thyroid · 13 of 83 slices shown]
[im 1/83]
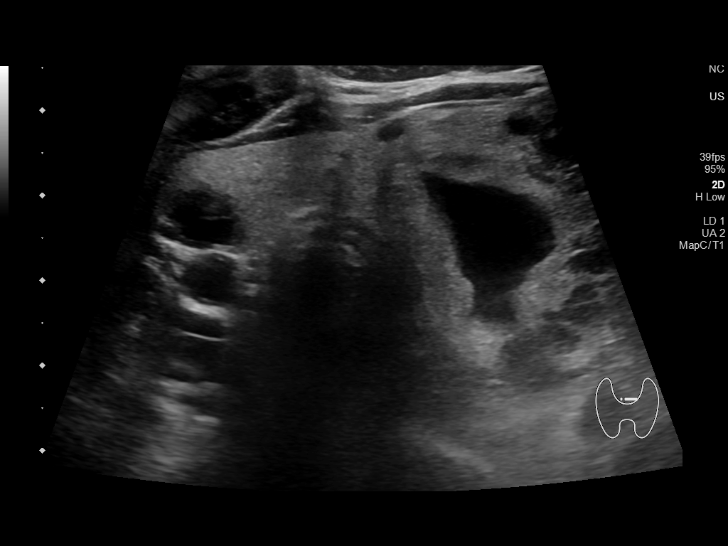
[im 7/83]
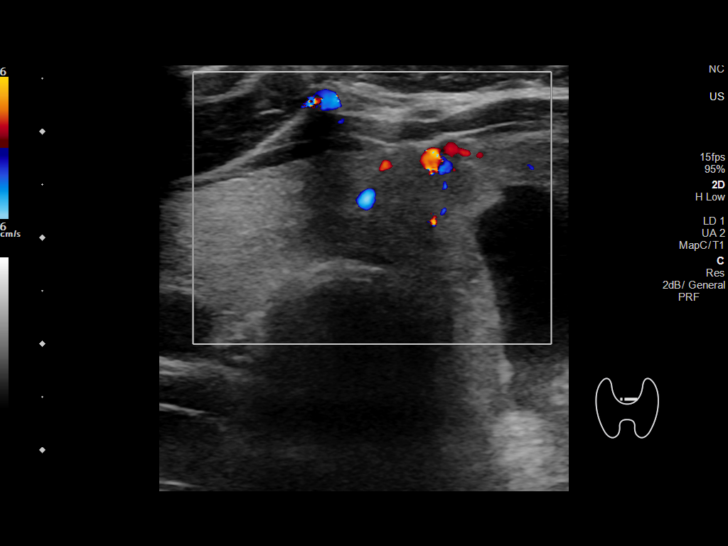
[im 14/83]
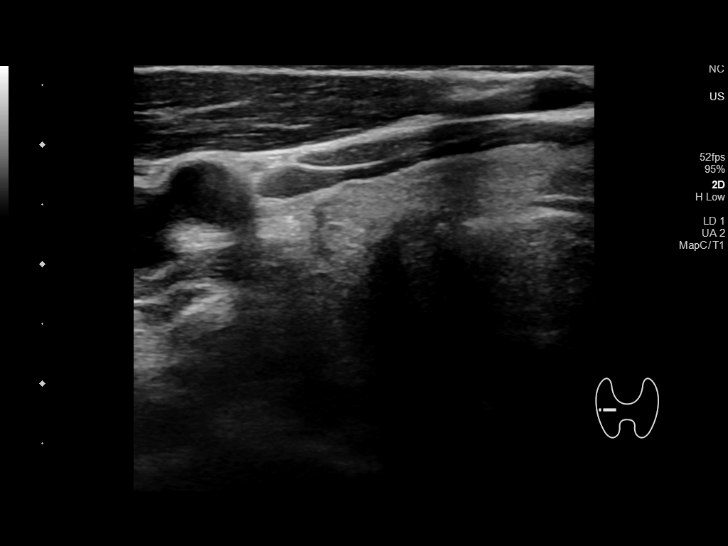
[im 21/83]
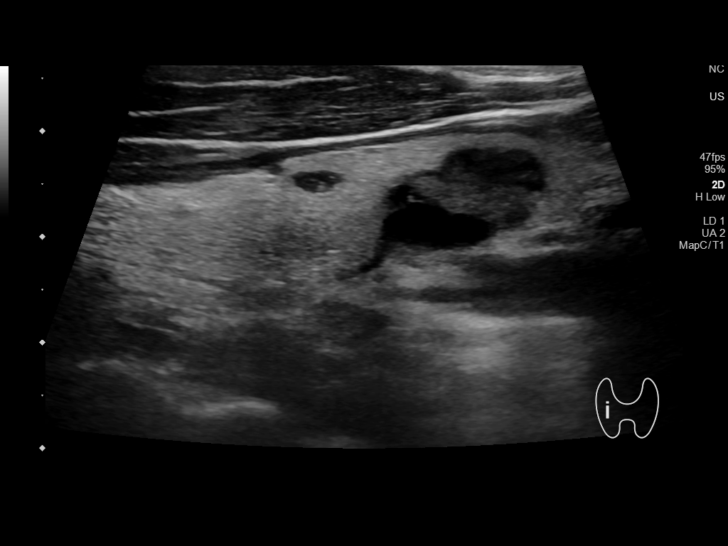
[im 28/83]
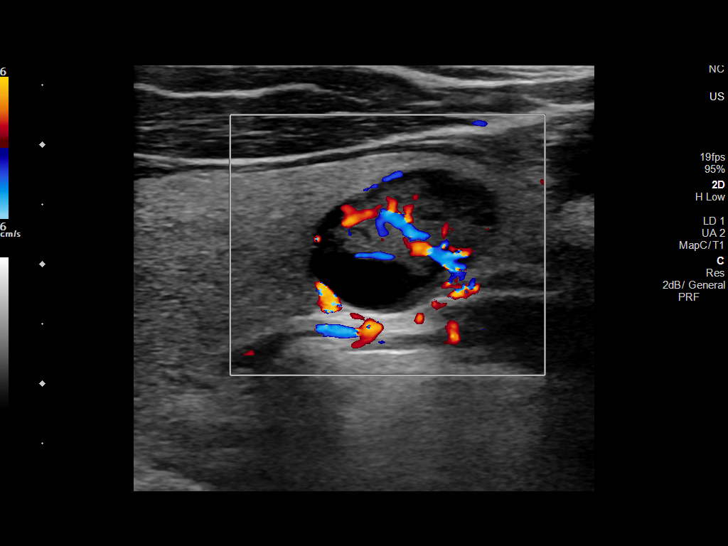
[im 35/83]
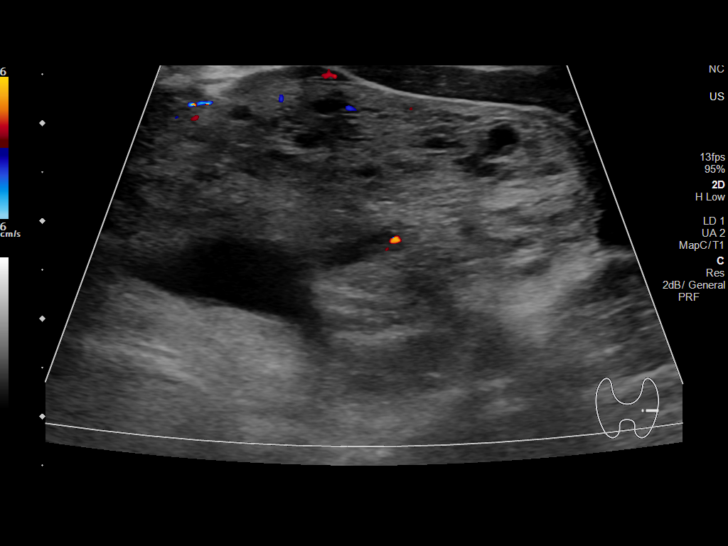
[im 42/83]
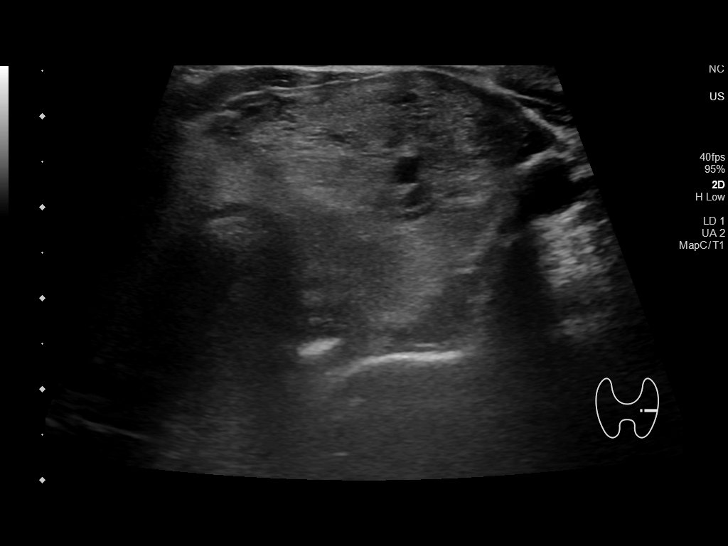
[im 48/83]
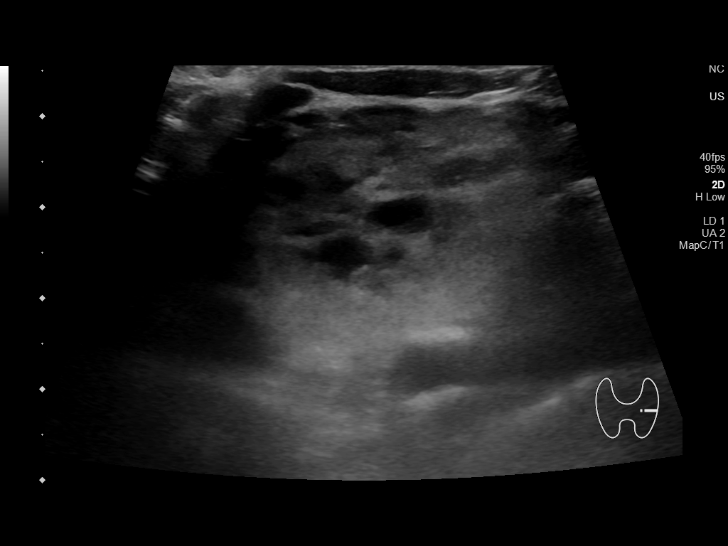
[im 55/83]
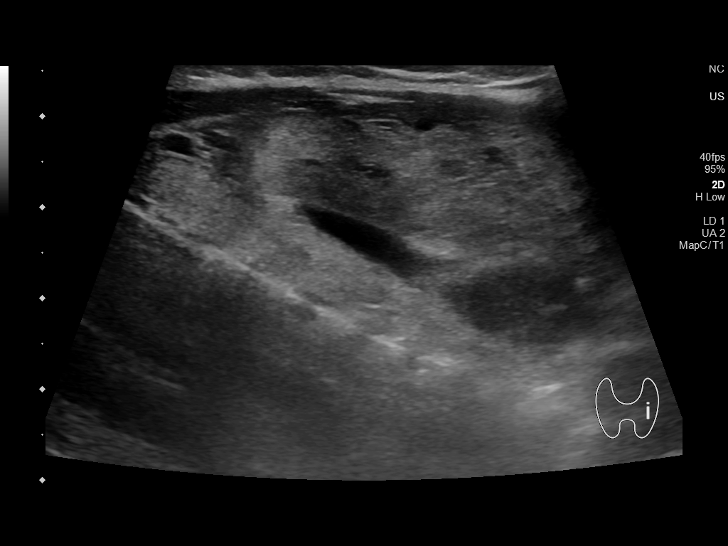
[im 62/83]
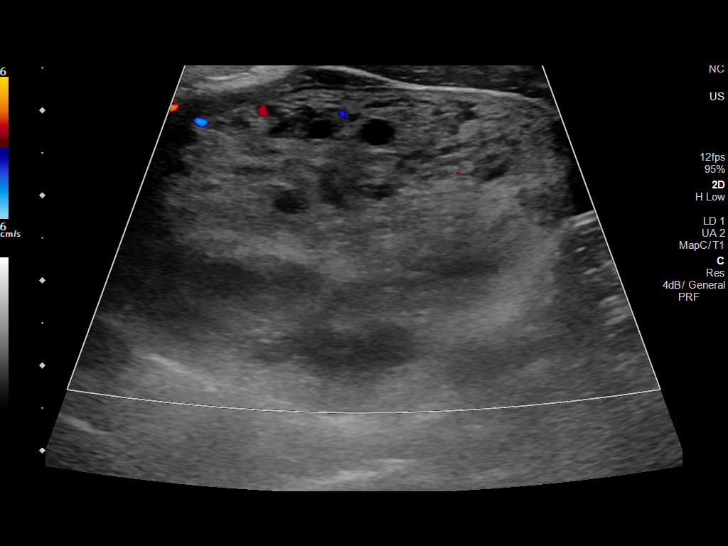
[im 69/83]
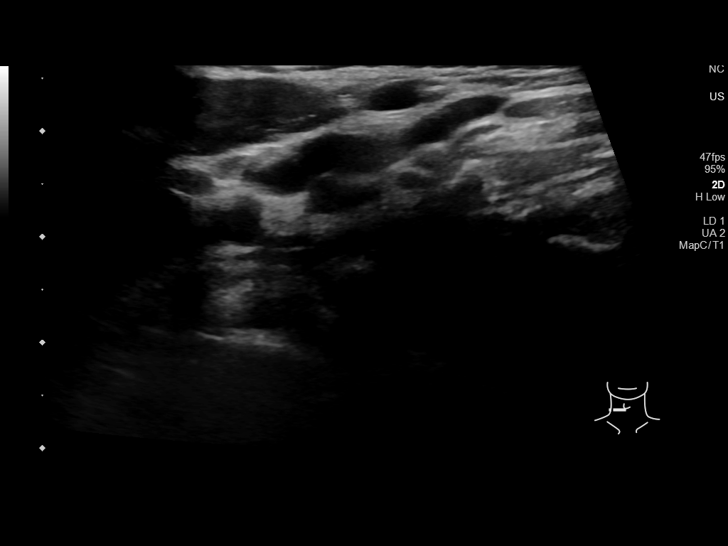
[im 76/83]
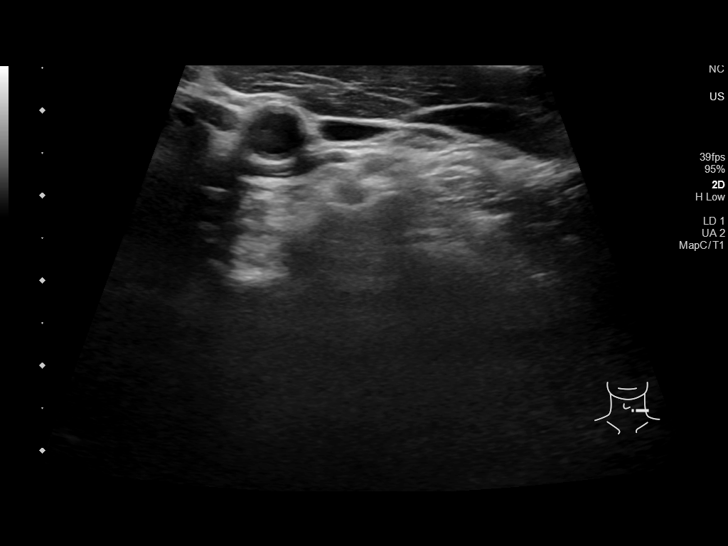
[im 83/83]
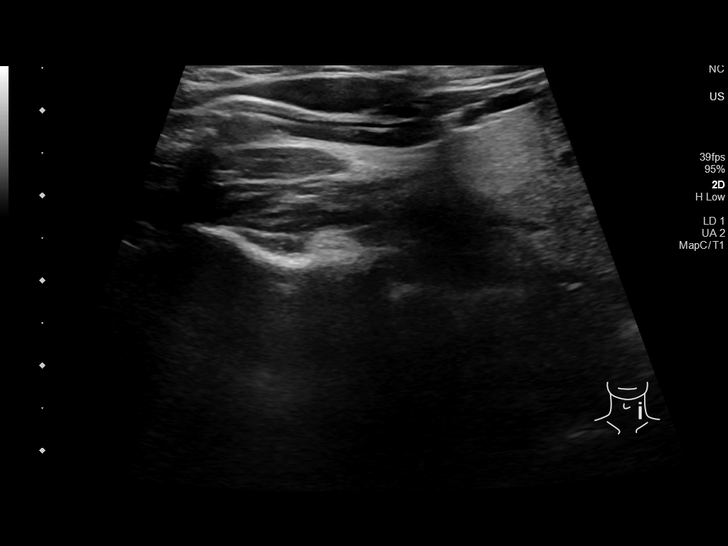

[13 of 25 positions shown; findings below may reference images not displayed]

FINDINGS: Parenchymal Echotexture: Mildly heterogenous

Isthmus: 0.9 cm

Right lobe: 5.1 cm x 1.2 cm x 1.4 cm

Left lobe: 8.2 cm x 3.8 cm x 4.5 cm

_________________________________________________________

Estimated total number of nodules >/= 1 cm: 2

Number of spongiform nodules >/=  2 cm not described below (TR1): 0

Number of mixed cystic and solid nodules >/= 1.5 cm not described
below (TR2): 0

_________________________________________________________

Nodule # 1:

Location: Right; Inferior

Maximum size: 1.6 cm; Other 2 dimensions: 1.2 cm x 1.0 cm

Composition: mixed cystic and solid (1)

Echogenicity: hypoechoic (2)

Shape: not taller-than-wide (0)

Margins: smooth (0)

Echogenic foci: none (0)

ACR TI-RADS total points: 3.

ACR TI-RADS risk category: TR3 (3 points).

ACR TI-RADS recommendations:

Nodule meets criteria for surveillance

_________________________________________________________

Nodule # 2:

Location: Left; Superior

Maximum size: 6.1 cm; Other 2 dimensions: 4.0 cm x 6.0 cm

Composition: spongiform (0)

ACR TI-RADS recommendations:

Spongiform nodule does not meet criteria for surveillance or biopsy

_________________________________________________________

Left-sided nodule has been previously biopsied.

No adenopathy
IMPRESSION: Multinodular thyroid again demonstrated.

The left-sided thyroid nodule has been previously biopsied. Assuming
a benign result, no further specific follow-up would be indicated.
Recommend correlation with prior biopsy result.

Right inferior thyroid nodule (labeled 1) meets criteria for
surveillance, as designated by the newly established ACR TI-RADS
criteria. Surveillance ultrasound study recommended to be performed
annually up to 5 years.

Recommendations follow those established by the new ACR TI-RADS
criteria ([HOSPITAL] 7016;[DATE]).

## 2021-07-09 ENCOUNTER — Inpatient Hospital Stay (HOSPITAL_BASED_OUTPATIENT_CLINIC_OR_DEPARTMENT_OTHER): Payer: 59 | Admitting: Family

## 2021-07-09 ENCOUNTER — Other Ambulatory Visit: Payer: Self-pay

## 2021-07-09 ENCOUNTER — Encounter: Payer: Self-pay | Admitting: Family

## 2021-07-09 NOTE — Progress Notes (Signed)
Hematology and Oncology Follow Up Visit  Shannetta Seitzinger [id number] Apr 11, 1971 50 y.o. 07/09/2021   Principle Diagnosis:  Hemochromatosis, heterozygous for the H63D mutation  Current Therapy:   Donates with [facility] to maintain Iron saturation < 50% and ferritin < 100   Interim History:  Ms. Paull is here today for follow-up. 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  Her iron studies have remained therapeutic.  She had a repeat thyroid US 2 weeks ago and multinodular thyroid was again noted and she will have a follow-up US again in a year.  No fever, chills, n/v, cough, rash, dizziness, SOB, chest pain, palpitations, abdominal pain or changes in bowel or bladder habits.  No swelling, tenderness, numbness or tingling in her extremities.  No falls or syncope.  She has maintained a good appetite and is staying well hydrated. Her weight is stable at 176 lbs.   ECOG Performance Status: 1 - Symptomatic but completely ambulatory  Medications:  Allergies as of 07/09/2021   No Known Allergies      Medication List        Accurate as of July 09, 2021  2:53 PM. If you have any questions, ask your nurse or doctor.          estradiol 0.1 MG/GM vaginal cream Commonly known as: ESTRACE Place 1 Applicatorful vaginally 3 (three) times a week.   loratadine 10 MG tablet Commonly known as: CLARITIN Take by mouth.        Allergies: No Known Allergies  Past Medical History, Surgical history, Social history, and Family History were reviewed and updated.  Review of Systems: All other 10 point review of systems is negative.   Physical Exam:  weight is 176 lb (79.8 kg). Her oral temperature is 97.6 F (36.4 C). Her blood pressure is 130/82 and her pulse is 82. Her respiration is 17 and oxygen saturation is 98%.   Wt Readings from Last 3 Encounters:  07/09/21 176 lb (79.8 kg)  06/18/21 172 lb (78 kg)  04/26/21 169 lb (76.7 kg)    Ocular:  Sclerae unicteric, pupils equal, round and reactive to light Ear-nose-throat: Oropharynx clear, dentition fair Lymphatic: No cervical or supraclavicular adenopathy Lungs no rales or rhonchi, good excursion bilaterally Heart regular rate and rhythm, no murmur appreciated Abd soft, nontender, positive bowel sounds MSK no focal spinal tenderness, no joint edema Neuro: non-focal, well-oriented, appropriate affect Breasts: Deferred   Lab Results  Component Value Date   WBC 5.1 06/26/2021   HGB 12.6 06/26/2021   HCT 37.5 06/26/2021   MCV 90.4 06/26/2021   PLT 227 06/26/2021   Lab Results  Component Value Date   FERRITIN 64 06/26/2021   IRON 52 06/26/2021   TIBC 246 06/26/2021   UIBC 194 06/26/2021   IRONPCTSAT 21 06/26/2021   Lab Results  Component Value Date   RBC 4.15 06/26/2021   No results found for: KPAFRELGTCHN, LAMBDASER, KAPLAMBRATIO No results found for: IGGSERUM, IGA, IGMSERUM No results found for: Odetta Pink, SPEI   Chemistry      Component Value Date/Time   NA 138 06/26/2021 0916   NA 137 03/01/2021 1216   K 4.6 06/26/2021 0916   CL 103 06/26/2021 0916   CO2 26 06/26/2021 0916   BUN 18 06/26/2021 0916   BUN 11 03/01/2021 1216   CREATININE 0.91 06/26/2021 0916   CREATININE 0.86 07/29/2013 1019      Component  Value Date/Time   CALCIUM 9.3 06/26/2021 0916   ALKPHOS 61 06/26/2021 0916   AST 14 (L) 06/26/2021 0916   ALT 10 06/26/2021 0916   BILITOT 0.3 06/26/2021 0916       Impression and Plan: Ms. Claeys is a very pleasant 50 yo caucasian female with recent diagnosis of hemochromatosis, heterozygous for the H63D mutation.  Iron studies are stable. No phlebotomy needed at this time.  She is interest in donating with [facility] if every needed.  Lab only in 6 weeks in [place] (prescription given to patient) and follow-up in 3 months.  She can contact our office with any questions or concerns.   Laverna Peace, NP 8/23/20222:53 PM

## 2021-10-21 ENCOUNTER — Ambulatory Visit: Payer: 59 | Admitting: Family

## 2021-10-21 ENCOUNTER — Other Ambulatory Visit: Payer: 59

## 2021-10-31 ENCOUNTER — Inpatient Hospital Stay: Payer: 59 | Attending: Hematology & Oncology

## 2021-10-31 ENCOUNTER — Telehealth: Payer: Self-pay | Admitting: *Deleted

## 2021-10-31 ENCOUNTER — Other Ambulatory Visit: Payer: Self-pay | Admitting: *Deleted

## 2021-10-31 ENCOUNTER — Other Ambulatory Visit: Payer: Self-pay

## 2021-10-31 LAB — CBC WITH DIFFERENTIAL (CANCER CENTER ONLY)
Abs Immature Granulocytes: 0.01 10*3/uL (ref 0.00–0.07)
Basophils Absolute: 0 10*3/uL (ref 0.0–0.1)
Basophils Relative: 0 %
Eosinophils Absolute: 0.1 10*3/uL (ref 0.0–0.5)
Eosinophils Relative: 3 %
HCT: 42.9 % (ref 36.0–46.0)
Hemoglobin: 14.4 g/dL (ref 12.0–15.0)
Immature Granulocytes: 0 %
Lymphocytes Relative: 41 %
Lymphs Abs: 1.8 10*3/uL (ref 0.7–4.0)
MCH: 29.6 pg (ref 26.0–34.0)
MCHC: 33.6 g/dL (ref 30.0–36.0)
MCV: 88.3 fL (ref 80.0–100.0)
Monocytes Absolute: 0.3 10*3/uL (ref 0.1–1.0)
Monocytes Relative: 7 %
Neutro Abs: 2.2 10*3/uL (ref 1.7–7.7)
Neutrophils Relative %: 49 %
Platelet Count: 238 10*3/uL (ref 150–400)
RBC: 4.86 MIL/uL (ref 3.87–5.11)
RDW: 12.3 % (ref 11.5–15.5)
WBC Count: 4.5 10*3/uL (ref 4.0–10.5)
nRBC: 0 % (ref 0.0–0.2)

## 2021-10-31 LAB — CMP (CANCER CENTER ONLY)
ALT: 14 U/L (ref 0–44)
AST: 18 U/L (ref 15–41)
Albumin: 4.7 g/dL (ref 3.5–5.0)
Alkaline Phosphatase: 67 U/L (ref 38–126)
Anion gap: 6 (ref 5–15)
BUN: 17 mg/dL (ref 6–20)
CO2: 30 mmol/L (ref 22–32)
Calcium: 9.7 mg/dL (ref 8.9–10.3)
Chloride: 103 mmol/L (ref 98–111)
Creatinine: 0.88 mg/dL (ref 0.44–1.00)
GFR, Estimated: >60 mL/min (ref >60)
Glucose, Bld: 104 mg/dL — ABNORMAL HIGH (ref 70–99)
Potassium: 4.6 mmol/L (ref 3.5–5.1)
Sodium: 139 mmol/L (ref 135–145)
Total Bilirubin: 0.6 mg/dL (ref 0.3–1.2)
Total Protein: 7.4 g/dL (ref 6.5–8.1)

## 2021-10-31 LAB — IRON AND TIBC
Iron: 115 ug/dL (ref 41–142)
Saturation Ratios: 43 % (ref 21–57)
TIBC: 265 ug/dL (ref 236–444)
UIBC: 150 ug/dL (ref 120–384)

## 2021-10-31 LAB — FERRITIN: Ferritin: 139 ng/mL (ref 11–307)

## 2021-10-31 NOTE — Telephone Encounter (Signed)
Opened in error

## 2021-10-31 NOTE — Telephone Encounter (Signed)
Call received from patient requesting to come in for lab work missed from November.  OK for pt to come in per order of Dr. Marin Olp.  Message sent to scheduling.

## 2021-11-04 ENCOUNTER — Telehealth: Payer: Self-pay | Admitting: *Deleted

## 2021-11-04 NOTE — Telephone Encounter (Signed)
-----   Message from Celso Amy, NP sent at 11/04/2021  4:04 PM EST ----- She needs one phlebotomy please. She can come here or go to donate with [facility]. If she prefers to donate with [facility] I will need to send them an order form. Please let me know.     ----- Message ----- From: Interface, Lab In [place] Sent: 10/31/2021  10:39 AM EST To: Celso Amy, NP

## 2021-11-04 NOTE — Telephone Encounter (Signed)
As noted below by Gillian Shields, I informed the patient that she needs one phlebotomy. She would like to have it done here at the [facility] office. LOS sent to scheduling.

## 2021-11-05 ENCOUNTER — Telehealth: Payer: Self-pay | Admitting: *Deleted

## 2021-11-05 ENCOUNTER — Telehealth: Payer: Self-pay | Admitting: Family

## 2021-11-05 NOTE — Telephone Encounter (Signed)
Per scheduling message Felicia Campos - patient has been scheduled for a phlebotomy and confirmed

## 2021-11-05 NOTE — Telephone Encounter (Signed)
Called patient per 12/19 sch msg - no answer. Left message for patient to call back to schedule appt for phlebotomy .

## 2021-11-06 ENCOUNTER — Inpatient Hospital Stay: Payer: 59

## 2021-11-06 ENCOUNTER — Other Ambulatory Visit: Payer: Self-pay

## 2021-11-06 ENCOUNTER — Other Ambulatory Visit: Payer: Self-pay | Admitting: Family

## 2021-11-06 MED ORDER — SODIUM CHLORIDE 0.9 % IV SOLN: INTRAVENOUS | Status: DC

## 2021-11-06 NOTE — Progress Notes (Signed)
Felicia Campos presents today for phlebotomy per MD orders. Phlebotomy procedure started at 1030 and ended at 1045. 508 grams removed with 20 gauge angiocath.   IV Fluids started  Patient observed for 30 minutes after procedure without any incident. Patient tolerated procedure well. IV needle removed intact.

## 2021-11-06 NOTE — Patient Instructions (Signed)
Therapeutic Phlebotomy °Therapeutic phlebotomy is the planned removal of blood from a person's body for the purpose of treating a medical condition. The procedure is lot like donating blood. Usually, about a pint (470 mL, or 0.47 L) of blood is removed. The average adult has 9-12 pints (4.3-5.7 L) of blood in his or her body. °Therapeutic phlebotomy may be used to treat the following medical conditions: °Hemochromatosis. This is a condition in which the blood contains too much iron. °Polycythemia vera. This is a condition in which the blood contains too many red blood cells. °Porphyria cutanea tarda. This is a disease in which an important part of hemoglobin is not made properly. It results in the buildup of abnormal amounts of porphyrins in the body. °Sickle cell disease. This is a condition in which the red blood cells form an abnormal crescent shape rather than a round shape. °Tell a health care provider about: °Any allergies you have. °All medicines you are taking, including vitamins, herbs, eye drops, creams, and over-the-counter medicines. °Any bleeding problems you have. °Any surgeries you have had. °Any medical conditions you have. °Whether you are pregnant or may be pregnant. °What are the risks? °Generally, this is a safe procedure. However, problems may occur, including: °Nausea or light-headedness. °Low blood pressure (hypotension). °Soreness, bleeding, swelling, or bruising at the needle insertion site. °Infection. °What happens before the procedure? °Ask your health care provider about: °Changing or stopping your regular medicines. This is especially important if you are taking diabetes medicines or blood thinners. °Taking medicines such as aspirin and ibuprofen. These medicines can thin your blood. Do not take these medicines unless your health care provider tells you to take them. °Taking over-the-counter medicines, vitamins, herbs, and supplements. °Wear clothing with sleeves that can be raised  above the elbow. °You may have a blood sample taken. °Your blood pressure, pulse rate, and breathing rate will be measured. °What happens during the procedure? ° °You may be given a medicine to numb the area (local anesthetic). °A tourniquet will be placed on your arm. °A needle will be put into one of your veins. °Tubing and a collection bag will be attached to the needle. °Blood will flow through the needle and tubing into the collection bag. °The collection bag will be placed lower than your arm so gravity can help the blood flow into the bag. °You may be asked to open and close your hand slowly and continually during the entire collection. °After the specified amount of blood has been removed from your body, the collection bag and tubing will be clamped. °The needle will be removed from your vein. °Pressure will be held on the needle site to stop the bleeding. °A bandage (dressing) will be placed over the needle insertion site. °The procedure may vary among health care providers and hospitals. °What happens after the procedure? °Your blood pressure, pulse rate, and breathing rate will be measured after the procedure. °You will be encouraged to drink fluids. °You will be encouraged to eat a snack to prevent a low blood sugar level. °Your recovery will be assessed and monitored. °Return to your normal activities as told by your health care provider. °Summary °Therapeutic phlebotomy is the planned removal of blood from a person's body for the purpose of treating a medical condition. °Therapeutic phlebotomy may be used to treat hemochromatosis, polycythemia vera, porphyria cutanea tarda, or sickle cell disease. °In the procedure, a needle is inserted and about a pint (470 mL, or 0.47 L) of blood is   removed. The average adult has 9-12 pints (4.3-5.7 L) of blood in the body. °This is generally a safe procedure, but it can sometimes cause problems such as nausea, light-headedness, or low blood pressure  (hypotension). °This information is not intended to replace advice given to you by your health care provider. Make sure you discuss any questions you have with your health care provider. °Document Revised: 05/01/2021 Document Reviewed: 05/01/2021 °Elsevier Patient Education © 2022 Elsevier Inc. ° °

## 2022-02-03 ENCOUNTER — Inpatient Hospital Stay: Payer: 59 | Admitting: Family

## 2022-02-03 ENCOUNTER — Inpatient Hospital Stay: Payer: 59 | Attending: Hematology & Oncology

## 2022-05-28 ENCOUNTER — Inpatient Hospital Stay: Payer: 59 | Attending: Hematology & Oncology

## 2022-05-28 ENCOUNTER — Encounter: Payer: Self-pay | Admitting: Family

## 2022-05-28 ENCOUNTER — Inpatient Hospital Stay (HOSPITAL_BASED_OUTPATIENT_CLINIC_OR_DEPARTMENT_OTHER): Payer: 59 | Admitting: Family

## 2022-05-28 LAB — CMP (CANCER CENTER ONLY)
ALT: 11 U/L (ref 0–44)
AST: 16 U/L (ref 15–41)
Albumin: 4.9 g/dL (ref 3.5–5.0)
Alkaline Phosphatase: 55 U/L (ref 38–126)
Anion gap: 8 (ref 5–15)
BUN: 16 mg/dL (ref 6–20)
CO2: 28 mmol/L (ref 22–32)
Calcium: 9.8 mg/dL (ref 8.9–10.3)
Chloride: 103 mmol/L (ref 98–111)
Creatinine: 0.88 mg/dL (ref 0.44–1.00)
GFR, Estimated: >60 mL/min (ref >60)
Glucose, Bld: 98 mg/dL (ref 70–99)
Potassium: 4.3 mmol/L (ref 3.5–5.1)
Sodium: 139 mmol/L (ref 135–145)
Total Bilirubin: 0.5 mg/dL (ref 0.3–1.2)
Total Protein: 6.9 g/dL (ref 6.5–8.1)

## 2022-05-28 LAB — CBC WITH DIFFERENTIAL (CANCER CENTER ONLY)
Abs Immature Granulocytes: 0.01 10*3/uL (ref 0.00–0.07)
Basophils Absolute: 0 10*3/uL (ref 0.0–0.1)
Basophils Relative: 1 %
Eosinophils Absolute: 0.1 10*3/uL (ref 0.0–0.5)
Eosinophils Relative: 2 %
HCT: 43.2 % (ref 36.0–46.0)
Hemoglobin: 14.2 g/dL (ref 12.0–15.0)
Immature Granulocytes: 0 %
Lymphocytes Relative: 39 %
Lymphs Abs: 1.9 10*3/uL (ref 0.7–4.0)
MCH: 29.5 pg (ref 26.0–34.0)
MCHC: 32.9 g/dL (ref 30.0–36.0)
MCV: 89.8 fL (ref 80.0–100.0)
Monocytes Absolute: 0.4 10*3/uL (ref 0.1–1.0)
Monocytes Relative: 7 %
Neutro Abs: 2.6 10*3/uL (ref 1.7–7.7)
Neutrophils Relative %: 51 %
Platelet Count: 244 10*3/uL (ref 150–400)
RBC: 4.81 MIL/uL (ref 3.87–5.11)
RDW: 11.8 % (ref 11.5–15.5)
WBC Count: 5 10*3/uL (ref 4.0–10.5)
nRBC: 0 % (ref 0.0–0.2)

## 2022-05-28 LAB — FERRITIN: Ferritin: 99 ng/mL (ref 11–307)

## 2022-05-28 NOTE — Progress Notes (Signed)
Hematology and Oncology Follow Up Visit  Felicia Campos [id number] 06-29-1971 51 y.o. 05/28/2022   Principle Diagnosis:  Hemochromatosis, heterozygous for the H63D mutation   Current Therapy:        Donates with [facility] to maintain Iron saturation < 50% and ferritin < 100   Interim History:  Felicia Campos is here today for follow-up. She is doing well and has no complaints at this time.  She last donated in December 2022 and has not had her iron studies checked since that time. She had to cancel prior appointments.  Iron studies are pending.  No fever, chills, n/v, cough, rash, dizziness, SOB, chest pain, palpitations, abdominal pain or changes in bowel or bladder habits at this time.  No bruising or petechiae.  No falls or syncope to report.  No swelling, tenderness, numbness or tingling in her extremities.  Appetite and hydration are good at this time.Weight is stable at 179 lbs.   ECOG Performance Status: 1 - Symptomatic but completely ambulatory  Medications:  Allergies as of 05/28/2022   No Known Allergies      Medication Campos        Accurate as of May 28, 2022  1:34 PM. If you have any questions, ask your nurse or doctor.          estradiol 0.1 MG/GM vaginal cream Commonly known as: ESTRACE Place 1 Applicatorful vaginally 3 (three) times a week.   loratadine 10 MG tablet Commonly known as: CLARITIN Take by mouth.        Allergies: No Known Allergies  Past Medical History, Surgical history, Social history, and Family History were reviewed and updated.  Review of Systems: All other 10 point review of systems is negative.   Physical Exam:  weight is 179 lb (81.2 kg). Her oral temperature is 98.1 F (36.7 C). Her blood pressure is 143/89 (abnormal) and her pulse is 67. Her respiration is 17 and oxygen saturation is 100%.   Wt Readings from Last 3 Encounters:  05/28/22 179 lb (81.2 kg)  07/09/21 176 lb (79.8 kg)  06/18/21 172 lb (78 kg)    Ocular:  Sclerae unicteric, pupils equal, round and reactive to light Ear-nose-throat: Oropharynx clear, dentition fair Lymphatic: No cervical or supraclavicular adenopathy Lungs no rales or rhonchi, good excursion bilaterally Heart regular rate and rhythm, no murmur appreciated Abd soft, nontender, positive bowel sounds MSK no focal spinal tenderness, no joint edema Neuro: non-focal, well-oriented, appropriate affect Breasts: Deferred   Lab Results  Component Value Date   WBC 5.0 05/28/2022   HGB 14.2 05/28/2022   HCT 43.2 05/28/2022   MCV 89.8 05/28/2022   PLT 244 05/28/2022   Lab Results  Component Value Date   FERRITIN 139 10/31/2021   IRON 115 10/31/2021   TIBC 265 10/31/2021   UIBC 150 10/31/2021   IRONPCTSAT 43 10/31/2021   Lab Results  Component Value Date   RBC 4.81 05/28/2022   No results found for: "KPAFRELGTCHN", "LAMBDASER", "KAPLAMBRATIO" No results found for: "IGGSERUM", "IGA", "IGMSERUM" No results found for: "TOTALPROTELP", "ALBUMINELP", "A1GS", "A2GS", "BETS", "BETA2SER", "GAMS", "MSPIKE", "SPEI"   Chemistry      Component Value Date/Time   NA 139 10/31/2021 1031   NA 137 03/01/2021 1216   K 4.6 10/31/2021 1031   CL 103 10/31/2021 1031   CO2 30 10/31/2021 1031   BUN 17 10/31/2021 1031   BUN 11 03/01/2021 1216   CREATININE 0.88 10/31/2021 1031   CREATININE 0.86 07/29/2013 1019  Component Value Date/Time   CALCIUM 9.7 10/31/2021 1031   ALKPHOS 67 10/31/2021 1031   AST 18 10/31/2021 1031   ALT 14 10/31/2021 1031   BILITOT 0.6 10/31/2021 1031       Impression and Plan: Felicia Campos is a very pleasant 51 yo caucasian female with recent diagnosis of hemochromatosis, heterozygous for the H63D mutation.  Iron studies are pending. We will have her donate with [facility] as needed.  Follow-up in 3 months.   Felicia Dawson, NP 7/12/20231:34 PM

## 2022-05-29 LAB — IRON AND IRON BINDING CAPACITY (CC-WL,HP ONLY)
Iron: 129 ug/dL (ref 28–170)
Saturation Ratios: 45 % — ABNORMAL HIGH (ref 10.4–31.8)
TIBC: 290 ug/dL (ref 250–450)
UIBC: 161 ug/dL (ref 148–442)

## 2022-05-30 ENCOUNTER — Telehealth: Payer: Self-pay

## 2022-05-30 NOTE — Telephone Encounter (Signed)
Advised via MyChart.

## 2022-05-30 NOTE — Telephone Encounter (Signed)
-----   Message from Celso Amy, NP sent at 05/30/2022 12:00 AM EDT ----- She needs to go donate once with [facility].    ----- Message ----- From: Interface, Lab In [place] Sent: 05/28/2022   1:25 PM EDT To: Celso Amy, NP

## 2022-08-15 ENCOUNTER — Telehealth: Payer: Self-pay | Admitting: *Deleted

## 2022-08-15 NOTE — Telephone Encounter (Signed)
Called and lvm of rescheduling appointment with Judson Roch - requested callback to reschedule.

## 2022-08-26 ENCOUNTER — Inpatient Hospital Stay: Payer: 59

## 2022-08-26 ENCOUNTER — Encounter: Payer: Self-pay | Admitting: Medical Oncology

## 2022-08-26 ENCOUNTER — Inpatient Hospital Stay: Payer: 59 | Attending: Hematology & Oncology | Admitting: Medical Oncology

## 2022-08-26 LAB — FERRITIN: Ferritin: 98 ng/mL (ref 11–307)

## 2022-08-26 LAB — CBC WITH DIFFERENTIAL (CANCER CENTER ONLY)
Abs Immature Granulocytes: 0.02 10*3/uL (ref 0.00–0.07)
Basophils Absolute: 0 10*3/uL (ref 0.0–0.1)
Basophils Relative: 1 %
Eosinophils Absolute: 0.2 10*3/uL (ref 0.0–0.5)
Eosinophils Relative: 4 %
HCT: 41.3 % (ref 36.0–46.0)
Hemoglobin: 13.7 g/dL (ref 12.0–15.0)
Immature Granulocytes: 0 %
Lymphocytes Relative: 39 %
Lymphs Abs: 2 10*3/uL (ref 0.7–4.0)
MCH: 29.7 pg (ref 26.0–34.0)
MCHC: 33.2 g/dL (ref 30.0–36.0)
MCV: 89.4 fL (ref 80.0–100.0)
Monocytes Absolute: 0.3 10*3/uL (ref 0.1–1.0)
Monocytes Relative: 7 %
Neutro Abs: 2.5 10*3/uL (ref 1.7–7.7)
Neutrophils Relative %: 49 %
Platelet Count: 256 10*3/uL (ref 150–400)
RBC: 4.62 MIL/uL (ref 3.87–5.11)
RDW: 12 % (ref 11.5–15.5)
WBC Count: 5.1 10*3/uL (ref 4.0–10.5)
nRBC: 0 % (ref 0.0–0.2)

## 2022-08-26 LAB — CMP (CANCER CENTER ONLY)
ALT: 12 U/L (ref 0–44)
AST: 16 U/L (ref 15–41)
Albumin: 4.6 g/dL (ref 3.5–5.0)
Alkaline Phosphatase: 54 U/L (ref 38–126)
Anion gap: 8 (ref 5–15)
BUN: 16 mg/dL (ref 6–20)
CO2: 28 mmol/L (ref 22–32)
Calcium: 9.6 mg/dL (ref 8.9–10.3)
Chloride: 101 mmol/L (ref 98–111)
Creatinine: 0.84 mg/dL (ref 0.44–1.00)
GFR, Estimated: >60 mL/min (ref >60)
Glucose, Bld: 101 mg/dL — ABNORMAL HIGH (ref 70–99)
Potassium: 4.4 mmol/L (ref 3.5–5.1)
Sodium: 137 mmol/L (ref 135–145)
Total Bilirubin: 0.6 mg/dL (ref 0.3–1.2)
Total Protein: 7.2 g/dL (ref 6.5–8.1)

## 2022-08-26 LAB — IRON AND IRON BINDING CAPACITY (CC-WL,HP ONLY)
Iron: 119 ug/dL (ref 28–170)
Saturation Ratios: 43 % — ABNORMAL HIGH (ref 10.4–31.8)
TIBC: 279 ug/dL (ref 250–450)
UIBC: 160 ug/dL (ref 148–442)

## 2022-08-26 NOTE — Progress Notes (Unsigned)
Hematology and Oncology Follow Up Visit  Kieanna Rollo [id number] [birth year] 51 y.o. 08/27/2022   Principle Diagnosis:  Hemochromatosis, heterozygous for the H63D mutation   Current Therapy:        Donates with [facility] to maintain Iron saturation < 50% and ferritin < 100   Interim History:  Ms. Quinlivan is here today for follow-up. She is doing well and has no complaints at this time.  Is enjoying spending time with her horses.  Her last donation of blood was prior to July of this year Her last iron saturation rate was 45% on 05/28/2022. Her last Ferritin was 99 on this same day No fever, chills, n/v, cough, rash, dizziness, SOB, chest pain, palpitations, abdominal pain or changes in bowel or bladder habits at this time.  No bruising or petechiae.  No falls or syncope to report.  No swelling, tenderness, numbness or tingling in her extremities.  Appetite and hydration are good at this time.Weight is stable at 179 lbs.   ECOG Performance Status: 1 - Symptomatic but completely ambulatory  Medications:  Allergies as of 08/26/2022   No Known Allergies      Medication List        Accurate as of August 26, 2022 11:59 PM. If you have any questions, ask your nurse or doctor.          estradiol 0.1 MG/GM vaginal cream Commonly known as: ESTRACE Place 1 Applicatorful vaginally 3 (three) times a week.   loratadine 10 MG tablet Commonly known as: CLARITIN Take by mouth.        Allergies: No Known Allergies  Past Medical History, Surgical history, Social history, and Family History were reviewed and updated.  Review of Systems: All other 10 point review of systems is negative.   Physical Exam:  weight is 180 lb 12.8 oz (82 kg). Her oral temperature is 97.9 F (36.6 C). Her blood pressure is 142/90 (abnormal) and her pulse is 73. Her respiration is 17 and oxygen saturation is 98%.   Wt Readings from Last 3 Encounters:  08/26/22 180 lb 12.8 oz (82 kg)  05/28/22 179  lb (81.2 kg)  07/09/21 176 lb (79.8 kg)    Ocular: Sclerae unicteric, pupils equal, round and reactive to light Ear-nose-throat: Oropharynx clear, dentition fair Lymphatic: No cervical or supraclavicular adenopathy Lungs no rales or rhonchi, good excursion bilaterally Heart regular rate and rhythm, no murmur appreciated Abd soft, nontender, positive bowel sounds MSK no focal spinal tenderness, no joint edema Neuro: non-focal, well-oriented, appropriate affect   Lab Results  Component Value Date   WBC 5.1 08/26/2022   HGB 13.7 08/26/2022   HCT 41.3 08/26/2022   MCV 89.4 08/26/2022   PLT 256 08/26/2022   Lab Results  Component Value Date   FERRITIN 98 08/26/2022   IRON 119 08/26/2022   TIBC 279 08/26/2022   UIBC 160 08/26/2022   IRONPCTSAT 43 (H) 08/26/2022   Lab Results  Component Value Date   RBC 4.62 08/26/2022   No results found for: "KPAFRELGTCHN", "LAMBDASER", "KAPLAMBRATIO" No results found for: "IGGSERUM", "IGA", "IGMSERUM" No results found for: "TOTALPROTELP", "ALBUMINELP", "A1GS", "A2GS", "BETS", "BETA2SER", "GAMS", "MSPIKE", "SPEI"   Chemistry      Component Value Date/Time   NA 137 08/26/2022 1254   NA 137 03/01/2021 1216   K 4.4 08/26/2022 1254   CL 101 08/26/2022 1254   CO2 28 08/26/2022 1254   BUN 16 08/26/2022 1254   BUN 11 03/01/2021 1216   CREATININE 0.84  08/26/2022 1254   CREATININE 0.86 07/29/2013 1019      Component Value Date/Time   CALCIUM 9.6 08/26/2022 1254   ALKPHOS 54 08/26/2022 1254   AST 16 08/26/2022 1254   ALT 12 08/26/2022 1254   BILITOT 0.6 08/26/2022 1254      Encounter Diagnosis  Name Primary?   Hereditary hemochromatosis ([place]) Yes    Impression and Plan: Ms. Klenke is a very pleasant 51 yo caucasian female with diagnosis of hemochromatosis, heterozygous for the H63D mutation.   Likely will need to donate based on last labs but will contact with results.  Iron studies are pending. We will have her donate with [facility]  as needed.  Follow-up in 3 months.   UPDATE: Iron Saturation is 43% (within target of < 45%) and ferritin is 98 (within target of <100) so no donation required at this time.   Hughie Closs, PA-C 10/11/202310:43 AM

## 2022-08-27 ENCOUNTER — Ambulatory Visit: Payer: 59 | Admitting: Family

## 2022-08-27 ENCOUNTER — Encounter: Payer: Self-pay | Admitting: Family

## 2022-08-27 ENCOUNTER — Other Ambulatory Visit: Payer: 59

## 2022-09-02 ENCOUNTER — Telehealth: Payer: Self-pay | Admitting: *Deleted

## 2022-09-02 NOTE — Telephone Encounter (Signed)
Per 08/26/22 los - called patient and lvm of upcoming appointments- requested callback to confirm

## 2022-10-31 ENCOUNTER — Encounter: Payer: Self-pay | Admitting: Family Medicine

## 2022-10-31 ENCOUNTER — Ambulatory Visit (INDEPENDENT_AMBULATORY_CARE_PROVIDER_SITE_OTHER): Payer: 59 | Admitting: Family Medicine

## 2022-10-31 VITALS — BP 139/78 | HR 78 | Temp 97.8°F | Ht 71.0 in | Wt 182.1 lb

## 2022-10-31 DIAGNOSIS — R030 Elevated blood-pressure reading, without diagnosis of hypertension: Secondary | ICD-10-CM

## 2022-10-31 DIAGNOSIS — W57XXXA Bitten or stung by nonvenomous insect and other nonvenomous arthropods, initial encounter: Secondary | ICD-10-CM

## 2022-10-31 DIAGNOSIS — E782 Mixed hyperlipidemia: Secondary | ICD-10-CM

## 2022-10-31 DIAGNOSIS — N951 Menopausal and female climacteric states: Secondary | ICD-10-CM

## 2022-10-31 DIAGNOSIS — Z1211 Encounter for screening for malignant neoplasm of colon: Secondary | ICD-10-CM

## 2022-10-31 DIAGNOSIS — E042 Nontoxic multinodular goiter: Secondary | ICD-10-CM

## 2022-10-31 DIAGNOSIS — S30861A Insect bite (nonvenomous) of abdominal wall, initial encounter: Secondary | ICD-10-CM

## 2022-10-31 MED ORDER — ESTRADIOL 0.1 MG/GM VA CREA: 1.0000 | TOPICAL_CREAM | VAGINAL | 12 refills | Status: DC

## 2022-10-31 NOTE — Progress Notes (Signed)
Established Patient Office Visit  Subjective   Patient ID: Felicia Campos, female    DOB: [birth year]  Age: 51 y.o. MRN: [id number]  Chief Complaint  Patient presents with   Tick Removal    HPI Felicia Campos is here for follow up of a tick bite.   She had a tick bite on her left side on 10/06/22. The tick was attached for 3 days. She then started to feel flu like symptoms and had a bulls eye rash. She was treated with doxycycline for 25ish days. She has now completed treatment and would like to have labs done.   She also reprots that her BP has been elevated at office visits recently. She has been taking her BP regularly while at work and her BP has been 130s/70s.   She would like a refill on vaginal estrace cream. This has helped with her menopausal vaginal dryness symptoms.  She continues to follow up with hematology. She last had labs done about 2 months ago. She has not follow up with endocrinology recently. She reprots that they discovered that her goiter is actually iron deposits.   She is fasting today and would like to have her cholesterol checked.      ROS As per HPI.    Objective:     BP 139/78   Pulse 78   Temp 97.8 F (36.6 C) (Temporal)   Ht '5\' 11"'$  (1.803 m)   Wt 182 lb 2 oz (82.6 kg)   LMP 05/28/2019   SpO2 95%   BMI 25.40 kg/m    Physical Exam Vitals and nursing note reviewed.  Constitutional:      General: She is not in acute distress.    Appearance: She is not ill-appearing, toxic-appearing or diaphoretic.  HENT:     Head: Normocephalic and atraumatic.  Neck:     Thyroid: Thyromegaly present. No thyroid mass or thyroid tenderness.  Cardiovascular:     Rate and Rhythm: Normal rate and regular rhythm.     Heart sounds: Normal heart sounds. No murmur heard. Pulmonary:     Effort: Pulmonary effort is normal. No respiratory distress.     Breath sounds: Normal breath sounds.  Skin:    General: Skin is warm and dry.     Findings: No rash.  Neurological:      General: No focal deficit present.     Mental Status: She is alert and oriented to person, place, and time.     Motor: No weakness.     Gait: Gait normal.  Psychiatric:        Mood and Affect: Mood normal.        Behavior: Behavior normal.        Thought Content: Thought content normal.        Judgment: Judgment normal.      No results found for any visits on 10/31/22.    The ASCVD Risk score (Arnett DK, et al., 2019) failed to calculate for the following reasons:   The valid HDL cholesterol range is 20 to 100 mg/dL    Assessment & Plan:   Felicia Campos was seen today for tick removal.  Diagnoses and all orders for this visit:  Elevated BP without diagnosis of hypertension Normal home BP and BP is at goal today. Discussed white coat syndrome. Continue to monitor home BP and notify for persistently elevated readings.  -     Lipid panel -     TSH  Mixed hyperlipidemia Labs pending.  -  Lipid panel  Menopausal vaginal dryness Well controlled on current regimen.  -     estradiol (ESTRACE) 0.1 MG/GM vaginal cream; Place 1 Applicatorful vaginally 3 (three) times a week.  Screening for colon cancer -     Cologuard  Tick bite of abdomen, initial encounter Bite occurred on 10/06/22, treated with doxycyline.  -     Alpha-Gal/ Panel -     Lyme Disease Serology w/Reflex  Multinodular goiter TSH pending. Managed by endo -     TSH  Hereditary hemochromatosis ([place]) Managed by hematology.   Return in about 1 year (around 11/01/2023) for CPE.   The patient indicates understanding of these issues and agrees with the plan.  Gwenlyn Perking, FNP

## 2022-11-03 LAB — LIPID PANEL
Chol/HDL Ratio: 2.5 ratio (ref 0.0–4.4)
Cholesterol, Total: 232 mg/dL — ABNORMAL HIGH (ref 100–199)
HDL: 92 mg/dL (ref >39)
LDL Chol Calc (NIH): 129 mg/dL — ABNORMAL HIGH (ref 0–99)
Triglycerides: 67 mg/dL (ref 0–149)
VLDL Cholesterol Cal: 11 mg/dL (ref 5–40)

## 2022-11-03 LAB — ALPHA-GAL PANEL
Allergen Lamb IgE: <0.1 kU/L
Beef IgE: <0.1 kU/L
IgE (Immunoglobulin E), Serum: 20 IU/mL (ref 6–495)
O215-IgE Alpha-Gal: <0.1 kU/L
Pork IgE: <0.1 kU/L

## 2022-11-03 LAB — LYME DISEASE SEROLOGY W/REFLEX: Lyme Total Antibody EIA: NEGATIVE

## 2022-11-03 LAB — TSH: TSH: 0.271 u[IU]/mL — ABNORMAL LOW (ref 0.450–4.500)

## 2022-11-25 ENCOUNTER — Telehealth: Payer: Self-pay | Admitting: Family Medicine

## 2022-11-26 NOTE — Telephone Encounter (Signed)
Spoke with patient, she is aware of lab results.

## 2022-11-27 ENCOUNTER — Ambulatory Visit: Payer: 59 | Admitting: Medical Oncology

## 2022-11-27 ENCOUNTER — Other Ambulatory Visit: Payer: 59

## 2023-01-01 LAB — COLOGUARD: COLOGUARD: NEGATIVE

## 2023-06-29 ENCOUNTER — Encounter: Payer: Self-pay | Admitting: Family Medicine

## 2023-12-02 ENCOUNTER — Other Ambulatory Visit: Payer: Self-pay | Admitting: Family Medicine

## 2023-12-02 DIAGNOSIS — N951 Menopausal and female climacteric states: Secondary | ICD-10-CM

## 2023-12-03 NOTE — Telephone Encounter (Signed)
Tiffany NTBS last OV 10/31/22 NO RF sent to pharmacy last OV greater than a year

## 2023-12-08 ENCOUNTER — Encounter: Payer: Self-pay | Admitting: Family

## 2023-12-17 ENCOUNTER — Telehealth: Payer: Self-pay | Admitting: Family Medicine

## 2023-12-17 NOTE — Telephone Encounter (Signed)
Copied from CRM [id number]. Topic: General - Other >> Dec 17, 2023  9:20 AM Felicia Campos wrote: Reason for CRM: patient needs bloodwork done for her iron level

## 2023-12-17 NOTE — Telephone Encounter (Signed)
Copied from CRM [id number]. Topic: Clinical - Medication Refill >> Dec 17, 2023  9:19 AM Louie Casa B wrote: Most Recent Primary Care Visit:  Provider: Gabriel Earing  Department[facility]  Visit Type: OFFICE VISIT  Date: 10/31/2022  Medication: estradiol (ESTRACE) 0.1 MG/GM vaginal cream  Has the patient contacted their pharmacy? Yes (Agent: If no, request that the patient contact the pharmacy for the refill. If patient does not wish to contact the pharmacy document the reason why and proceed with request.) (Agent: If yes, when and what did the pharmacy advise?)  Is this the correct pharmacy for this prescription? Yes If no, delete pharmacy and type the correct one.  This is the patient's preferred pharmacy:  [facility] [address] [place] - [address] [place] [address] [place] [address] Phone: [phone] Fax: [phone]   Has the prescription been filled recently? Yes  Is the patient out of the medication? Yes  Has the patient been seen for an appointment in the last year OR does the patient have an upcoming appointment? Yes  Can we respond through MyChart? No  Agent: Please be advised that Rx refills may take up to 3 business days. We ask that you follow-up with your pharmacy.

## 2023-12-17 NOTE — Telephone Encounter (Signed)
Lm on vm trying to schedule office. Refill not appropriate.

## 2023-12-20 ENCOUNTER — Other Ambulatory Visit: Payer: Self-pay | Admitting: Family Medicine

## 2023-12-20 DIAGNOSIS — N951 Menopausal and female climacteric states: Secondary | ICD-10-CM

## 2023-12-21 MED ORDER — ESTRADIOL 0.1 MG/GM VA CREA: 1.0000 | TOPICAL_CREAM | VAGINAL | 0 refills | Status: DC

## 2023-12-21 NOTE — Addendum Note (Signed)
Addended by: Julious Payer D on: 12/21/2023 09:28 AM   Modules accepted: Orders

## 2023-12-21 NOTE — Telephone Encounter (Signed)
Appt scheduled for 01/07/2024, please send refill to pharm

## 2023-12-21 NOTE — Telephone Encounter (Signed)
 Tiffany NTBS last OV 10/31/22 NO RF sent to pharmacy last OV greater than a year

## 2024-01-07 ENCOUNTER — Ambulatory Visit: Payer: Self-pay | Admitting: Family Medicine

## 2024-01-07 NOTE — Patient Instructions (Signed)
 Our records indicate that you are due for your annual mammogram/breast imaging. While there is no way to prevent breast cancer, early detection provides the best opportunity for curing it. For women over the age of 39, the American Cancer Society recommends a yearly clinical breast exam and a yearly mammogram. These practices have saved thousands of lives. We need your help to ensure that you are receiving optimal medical care. Please call the imaging location that has done you previous mammograms. Please remember to list [place] as your primary care. This helps make sure we receive a report and can update your chart.  Below is the contact information for several local breast imaging centers. You may call the location that works best for you, and they will be happy to assistance in making you an appointment. You do not need an order for a regular screening mammogram. However, if you are having any problems or concerns with you breast area, please let your primary care provider know, and appropriate orders will be placed. Please let our office know if you have any questions or concerns. Or if you need information for another imaging center not on this list or outside of the area. We are commented to working with you on your health care journey.   The mobile unit/bus ([facility]) - they come twice a month to our location.  These appointments can be made through our office or by call The Breast Center  [facility]  [address] [place], [place] [address] Phone [phone]  [facility]  [address] [place], [place] [address] [phone]  [facility] ([facility] of [facility])  [phone] [address], [place] [address] [phone]  [facility]  [address] [place] [place] [address] [phone]  [facility] - [facility]  [address] [place] [address] [phone]  [facility] Mammography in [place]  [address] [place], [place] [address] [phone]  [facility] [address] [place], [place] [address] [phone]  [facility] [address] [place], [place] [address] [phone]  [facility] [place], [place] [address] [phone]

## 2024-01-11 ENCOUNTER — Ambulatory Visit (INDEPENDENT_AMBULATORY_CARE_PROVIDER_SITE_OTHER): Payer: Self-pay | Admitting: Family Medicine

## 2024-01-11 ENCOUNTER — Encounter: Payer: Self-pay | Admitting: Family Medicine

## 2024-01-11 DIAGNOSIS — N951 Menopausal and female climacteric states: Secondary | ICD-10-CM

## 2024-01-11 DIAGNOSIS — E782 Mixed hyperlipidemia: Secondary | ICD-10-CM

## 2024-01-11 DIAGNOSIS — E042 Nontoxic multinodular goiter: Secondary | ICD-10-CM

## 2024-01-11 MED ORDER — ESTRADIOL 0.1 MG/GM VA CREA: 1.0000 | TOPICAL_CREAM | VAGINAL | 6 refills | Status: DC

## 2024-01-11 NOTE — Progress Notes (Signed)
   Established Patient Office Visit  Subjective   Patient ID: Felicia Campos, female    DOB: 10/21/1971  Age: 53 y.o. MRN: [id number]  Chief Complaint  Patient presents with   Medical Management of Chronic Issues    HPI Carrianne is here for a chronic follow up. She has been without insurance. Because of this she has not followed up with hematology. Last labs were at goal for hemachromatosis. Has some chronic intermittent restless legs. No other symptoms.   Needs refill on premarin cream. This has been helpful for postmenopausal vaginal symptoms.   Denies symptoms related to thyroid. Endo has determined goiter was due to iron deposits.     ROS Negative unless specially indicated above in HPI.    Objective:     BP 131/78   Pulse 73   Temp 98.4 F (36.9 C) (Temporal)   Ht 5\' 11"  (1.803 m)   Wt 188 lb 3.2 oz (85.4 kg)   LMP 05/28/2019   SpO2 99%   BMI 26.25 kg/m    Physical Exam Vitals and nursing note reviewed.  Constitutional:      General: She is not in acute distress.    Appearance: Normal appearance. She is not ill-appearing, toxic-appearing or diaphoretic.  Neck:     Thyroid: Thyromegaly present. No thyroid mass or thyroid tenderness.  Cardiovascular:     Rate and Rhythm: Normal rate and regular rhythm.     Heart sounds: Normal heart sounds. No murmur heard. Pulmonary:     Effort: Pulmonary effort is normal. No respiratory distress.     Breath sounds: Normal breath sounds. No wheezing, rhonchi or rales.  Abdominal:     General: Bowel sounds are normal. There is no distension.     Palpations: Abdomen is soft.     Tenderness: There is no abdominal tenderness. There is no guarding or rebound.  Musculoskeletal:     Right lower leg: No edema.     Left lower leg: No edema.  Skin:    General: Skin is warm and dry.  Neurological:     General: No focal deficit present.     Mental Status: She is alert and oriented to person, place, and time.  Psychiatric:         Mood and Affect: Mood normal.        Behavior: Behavior normal.    No results found for any visits on 01/11/24.    The 10-year ASCVD risk score (Arnett DK, et al., 2019) is: 1.3%    Assessment & Plan:   Carolan was seen today for medical management of chronic issues.  Diagnoses and all orders for this visit:  Hereditary hemochromatosis (HCC) Will check labs as below. Discussed will need hematology follow up if abnormal.  -     CBC with Differential/Platelet -     Iron, TIBC and Ferritin Panel -     CMP14+EGFR  Mixed hyperlipidemia No fasting today. Will check fasting lipid panel when she obtains insurance again.   Multinodular goiter Will check TSH today. No symptoms.  -     TSH  Menopausal vaginal dryness Well controlled on current regimen.  -     estradiol (ESTRACE) 0.1 MG/GM vaginal cream; Place 1 Applicatorful vaginally 3 (three) times a week.  Return in about 1 year (around 01/10/2025) for CPE.  The patient indicates understanding of these issues and agrees with the plan.    Gabriel Earing, FNP

## 2024-01-12 LAB — IRON,TIBC AND FERRITIN PANEL
Ferritin: 204 ng/mL — ABNORMAL HIGH (ref 15–150)
Iron Saturation: 51 % (ref 15–55)
Iron: 123 ug/dL (ref 27–159)
Total Iron Binding Capacity: 242 ug/dL — ABNORMAL LOW (ref 250–450)
UIBC: 119 ug/dL — ABNORMAL LOW (ref 131–425)

## 2024-01-12 LAB — CBC WITH DIFFERENTIAL/PLATELET
Basophils Absolute: 0 10*3/uL (ref 0.0–0.2)
Basos: 1 %
EOS (ABSOLUTE): 0.2 10*3/uL (ref 0.0–0.4)
Eos: 3 %
Hematocrit: 40.6 % (ref 34.0–46.6)
Hemoglobin: 14 g/dL (ref 11.1–15.9)
Immature Grans (Abs): 0 10*3/uL (ref 0.0–0.1)
Immature Granulocytes: 0 %
Lymphocytes Absolute: 1.7 10*3/uL (ref 0.7–3.1)
Lymphs: 32 %
MCH: 30 pg (ref 26.6–33.0)
MCHC: 34.5 g/dL (ref 31.5–35.7)
MCV: 87 fL (ref 79–97)
Monocytes Absolute: 0.4 10*3/uL (ref 0.1–0.9)
Monocytes: 7 %
Neutrophils Absolute: 3.1 10*3/uL (ref 1.4–7.0)
Neutrophils: 57 %
Platelets: 233 10*3/uL (ref 150–450)
RBC: 4.66 x10E6/uL (ref 3.77–5.28)
RDW: 11.8 % (ref 11.7–15.4)
WBC: 5.4 10*3/uL (ref 3.4–10.8)

## 2024-01-12 LAB — CMP14+EGFR
ALT: 12 [IU]/L (ref 0–32)
AST: 19 [IU]/L (ref 0–40)
Albumin: 4.4 g/dL (ref 3.8–4.9)
Alkaline Phosphatase: 69 [IU]/L (ref 44–121)
BUN/Creatinine Ratio: 16 (ref 9–23)
BUN: 13 mg/dL (ref 6–24)
Bilirubin Total: 0.5 mg/dL (ref 0.0–1.2)
CO2: 23 mmol/L (ref 20–29)
Calcium: 9.2 mg/dL (ref 8.7–10.2)
Chloride: 102 mmol/L (ref 96–106)
Creatinine, Ser: 0.79 mg/dL (ref 0.57–1.00)
Globulin, Total: 2.1 g/dL (ref 1.5–4.5)
Glucose: 122 mg/dL — ABNORMAL HIGH (ref 70–99)
Potassium: 4 mmol/L (ref 3.5–5.2)
Sodium: 144 mmol/L (ref 134–144)
Total Protein: 6.5 g/dL (ref 6.0–8.5)
eGFR: 89 mL/min/{1.73_m2} (ref >59)

## 2024-01-12 LAB — TSH: TSH: 0.486 u[IU]/mL (ref 0.450–4.500)

## 2024-01-13 ENCOUNTER — Encounter: Payer: Self-pay | Admitting: Family Medicine

## 2024-01-13 ENCOUNTER — Encounter: Payer: Self-pay | Admitting: *Deleted

## 2024-03-17 ENCOUNTER — Encounter: Payer: Self-pay | Admitting: Family

## 2024-03-17 ENCOUNTER — Ambulatory Visit: Payer: Self-pay

## 2024-03-17 NOTE — Telephone Encounter (Signed)
 Copied from CRM [id number]. Topic: Clinical - Red Word Triage >> Mar 17, 2024  2:45 PM Retta Caster wrote: Red Word that prompted transfer to Nurse Triage: vaginal infection/Itching for 3 days but worse last 1-2 days   Chief Complaint: Vaginal itching  Symptoms: Vaginal itching, increased fatigue  Frequency: Constant  Disposition: [] ED /[x] Urgent Care (no appt availability in office) / [] Appointment(In office/virtual)/ []   Virtual Care/ [] Home Care/ [] Refused Recommended Disposition /[]  Mobile Bus/ []  Follow-up with PCP Additional Notes: Patient states that she has had vaginal itching for the last 3 days. She states that with her itching she is also experiencing some increased fatigue. Patient states she has had similar symptoms with previous vaginal infections and would like to make an appointment. I advised her of available appointment times which she states she is unable to make and will instead go to urgent care.     Reason for Disposition  MODERATE-SEVERE itching (i.e., interferes with school, work, or sleep)  Answer Assessment - Initial Assessment Questions 1. SYMPTOM: "What's the main symptom you're concerned about?" (e.g., pain, itching, dryness)     Vagina itching  3. ONSET: "When did the itching start?"     3 days ago  4. PAIN: "Is there any pain?" If Yes, ask: "How bad is it?" (Scale: 1-10; mild, moderate, severe)   -  MILD (1-3): Doesn't interfere with normal activities.    -  MODERATE (4-7): Interferes with normal activities (e.g., work or school) or awakens from sleep.     -  SEVERE (8-10): Excruciating pain, unable to do any normal activities.     Mild to moderate  5. ITCHING: "Is there any itching?" If Yes, ask: "How bad is it?" (Scale: 1-10; mild, moderate, severe)     Moderate  6. CAUSE: "What do you think is causing the discharge?" "Have you had the same problem before? What happened then?"     Believes she has an infection  7. OTHER SYMPTOMS: "Do you have  any other symptoms?" (e.g., fever, itching, vaginal bleeding, pain with urination, injury to genital area, vaginal foreign body)     Some increased fatigue  Protocols used: Vaginal Symptoms-A-AH

## 2024-06-14 ENCOUNTER — Other Ambulatory Visit: Payer: Self-pay | Admitting: Nurse Practitioner

## 2024-06-14 DIAGNOSIS — Z1231 Encounter for screening mammogram for malignant neoplasm of breast: Secondary | ICD-10-CM

## 2024-06-15 ENCOUNTER — Inpatient Hospital Stay: Admission: RE | Admit: 2024-06-15 | Payer: Self-pay | Source: Ambulatory Visit

## 2024-10-15 ENCOUNTER — Other Ambulatory Visit: Payer: Self-pay | Admitting: Family Medicine

## 2024-10-15 DIAGNOSIS — N951 Menopausal and female climacteric states: Secondary | ICD-10-CM

## 2024-10-19 ENCOUNTER — Telehealth: Payer: Self-pay | Admitting: Family Medicine

## 2024-10-19 DIAGNOSIS — N951 Menopausal and female climacteric states: Secondary | ICD-10-CM

## 2024-10-19 NOTE — Telephone Encounter (Unsigned)
 Copied from CRM #[id number]. Topic: Clinical - Medication Refill >> Oct 19, 2024 12:14 PM Cherylann S wrote: Medication: estradiol  (ESTRACE ) 0.01 % CREA vaginal cream  Has the patient contacted their pharmacy? No (Agent: If no, request that the patient contact the pharmacy for the refill. If patient does not wish to contact the pharmacy document the reason why and proceed with request.) (Agent: If yes, when and what did the pharmacy advise?)  This is the patient's preferred pharmacy:  [id number] - [place], [place] [place] - [address] [place] [address] Phone: [phone] Fax: [phone]  Is this the correct pharmacy for this prescription? Yes If no, delete pharmacy and type the correct one.   Has the prescription been filled recently? No  Is the patient out of the medication? Yes  Has the patient been seen for an appointment in the last year OR does the patient have an upcoming appointment? Yes  Can we respond through MyChart? Yes  Agent: Please be advised that Rx refills may take up to 3 business days. We ask that you follow-up with your pharmacy.

## 2024-10-22 ENCOUNTER — Other Ambulatory Visit: Payer: Self-pay | Admitting: Family Medicine

## 2024-10-22 DIAGNOSIS — N951 Menopausal and female climacteric states: Secondary | ICD-10-CM

## 2024-11-24 ENCOUNTER — Encounter: Payer: Self-pay | Admitting: Family

## 2024-11-29 ENCOUNTER — Other Ambulatory Visit: Payer: Self-pay | Admitting: Medical Genetics

## 2024-11-30 ENCOUNTER — Ambulatory Visit: Payer: Self-pay | Admitting: Family Medicine

## 2024-12-11 ENCOUNTER — Other Ambulatory Visit: Payer: Self-pay | Admitting: Family Medicine

## 2024-12-11 DIAGNOSIS — N951 Menopausal and female climacteric states: Secondary | ICD-10-CM

## 2024-12-13 ENCOUNTER — Other Ambulatory Visit: Payer: Self-pay | Admitting: Family

## 2024-12-14 ENCOUNTER — Inpatient Hospital Stay: Admitting: Family

## 2024-12-14 ENCOUNTER — Inpatient Hospital Stay: Attending: Hematology & Oncology

## 2024-12-14 ENCOUNTER — Other Ambulatory Visit

## 2024-12-14 ENCOUNTER — Encounter: Payer: Self-pay | Admitting: Family

## 2024-12-14 LAB — IRON AND IRON BINDING CAPACITY (CC-WL,HP ONLY)
Iron: 95 ug/dL (ref 28–170)
Saturation Ratios: 30 % (ref 10.4–31.8)
TIBC: 318 ug/dL (ref 250–450)
UIBC: 223 ug/dL

## 2024-12-14 LAB — CBC WITH DIFFERENTIAL (CANCER CENTER ONLY)
Abs Immature Granulocytes: 0.01 10*3/uL (ref 0.00–0.07)
Basophils Absolute: 0 10*3/uL (ref 0.0–0.1)
Basophils Relative: 0 %
Eosinophils Absolute: 0.1 10*3/uL (ref 0.0–0.5)
Eosinophils Relative: 2 %
HCT: 43 % (ref 36.0–46.0)
Hemoglobin: 14.6 g/dL (ref 12.0–15.0)
Immature Granulocytes: 0 %
Lymphocytes Relative: 31 %
Lymphs Abs: 1.8 10*3/uL (ref 0.7–4.0)
MCH: 29.6 pg (ref 26.0–34.0)
MCHC: 34 g/dL (ref 30.0–36.0)
MCV: 87.2 fL (ref 80.0–100.0)
Monocytes Absolute: 0.3 10*3/uL (ref 0.1–1.0)
Monocytes Relative: 6 %
Neutro Abs: 3.5 10*3/uL (ref 1.7–7.7)
Neutrophils Relative %: 61 %
Platelet Count: 263 10*3/uL (ref 150–400)
RBC: 4.93 MIL/uL (ref 3.87–5.11)
RDW: 11.8 % (ref 11.5–15.5)
WBC Count: 5.8 10*3/uL (ref 4.0–10.5)
nRBC: 0 % (ref 0.0–0.2)

## 2024-12-14 LAB — CMP (CANCER CENTER ONLY)
ALT: 13 U/L (ref 0–44)
AST: 18 U/L (ref 15–41)
Albumin: 4.8 g/dL (ref 3.5–5.0)
Alkaline Phosphatase: 55 U/L (ref 38–126)
Anion gap: 12 (ref 5–15)
BUN: 13 mg/dL (ref 6–20)
CO2: 26 mmol/L (ref 22–32)
Calcium: 9.7 mg/dL (ref 8.9–10.3)
Chloride: 103 mmol/L (ref 98–111)
Creatinine: 0.84 mg/dL (ref 0.44–1.00)
GFR, Estimated: >60 mL/min
Glucose, Bld: 148 mg/dL — ABNORMAL HIGH (ref 70–99)
Potassium: 4.4 mmol/L (ref 3.5–5.1)
Sodium: 141 mmol/L (ref 135–145)
Total Bilirubin: 0.4 mg/dL (ref 0.0–1.2)
Total Protein: 7.5 g/dL (ref 6.5–8.1)

## 2024-12-14 LAB — FERRITIN: Ferritin: 91 ng/mL (ref 11–307)

## 2024-12-14 NOTE — Progress Notes (Signed)
 " Hematology and Oncology Follow Up Visit  Felicia Campos [id number] 1971-11-10 54 y.o. 12/14/2024   Principle Diagnosis:  Hemochromatosis, heterozygous for the H63D mutation   Current Therapy:        Donates with [facility] to maintain Iron saturation < 50% and ferritin < 100               Interim History:  Felicia Campos is here today to re-establish care for hemochromatosis. We last saw her in 2023.  She has been donating with [facility] [employer] every 8-10 weeks.  She is feeling fatigue, brain fog, joint aches and pains and less stamina.  No fever, chills, n/v, cough, rash, dizziness, SOB, chest pain, palpitations, abdominal pain or changes in bowel or bladder habits.  No numbness or tingling in her extremities at this time. She will occasionally have with cold temps.  Appetite and hydration are good. Weight is stable at 194 lbs.   ECOG Performance Status: 1 - Symptomatic but completely ambulatory  Medications:  Allergies as of 12/14/2024   No Known Allergies      Medication List        Accurate as of December 14, 2024  1:40 PM. If you have any questions, ask your nurse or doctor.          estradiol  0.01 % Crea vaginal cream Commonly known as: ESTRACE  INSERT 1 APPLICATORFUL VAGINALLY  THREE TIMES A WEEK        Allergies: Allergies[1]  Past Medical History, Surgical history, Social history, and Family History were reviewed and updated.  Review of Systems: All other 10 point review of systems is negative.   Physical Exam:  vitals were not taken for this visit.   Wt Readings from Last 3 Encounters:  01/11/24 188 lb 3.2 oz (85.4 kg)  10/31/22 182 lb 2 oz (82.6 kg)  08/26/22 180 lb 12.8 oz (82 kg)    Ocular: Sclerae unicteric, pupils equal, round and reactive to light Ear-nose-throat: Oropharynx clear, dentition fair Lymphatic: No cervical or supraclavicular adenopathy Lungs no rales or rhonchi, good excursion bilaterally Heart regular rate and rhythm, no murmur  appreciated Abd soft, nontender, positive bowel sounds MSK no focal spinal tenderness, no joint edema Neuro: non-focal, well-oriented, appropriate affect Breasts: Deferred   Lab Results  Component Value Date   WBC 5.4 01/11/2024   HGB 14.0 01/11/2024   HCT 40.6 01/11/2024   MCV 87 01/11/2024   PLT 233 01/11/2024   Lab Results  Component Value Date   FERRITIN 204 (H) 01/11/2024   IRON 123 01/11/2024   TIBC 242 (L) 01/11/2024   UIBC 119 (L) 01/11/2024   IRONPCTSAT 51 01/11/2024   Lab Results  Component Value Date   RBC 4.66 01/11/2024   No results found for: KPAFRELGTCHN, LAMBDASER, KAPLAMBRATIO No results found for: IGGSERUM, IGA, IGMSERUM No results found for: STEPHANY CARLOTA BENSON MARKEL EARLA JOANNIE DOC VICK, SPEI   Chemistry      Component Value Date/Time   NA 144 01/11/2024 1406   K 4.0 01/11/2024 1406   CL 102 01/11/2024 1406   CO2 23 01/11/2024 1406   BUN 13 01/11/2024 1406   CREATININE 0.79 01/11/2024 1406   CREATININE 0.84 08/26/2022 1254   CREATININE 0.86 07/29/2013 1019      Component Value Date/Time   CALCIUM 9.2 01/11/2024 1406   ALKPHOS 69 01/11/2024 1406   AST 19 01/11/2024 1406   AST 16 08/26/2022 1254   ALT 12 01/11/2024 1406   ALT 12 08/26/2022  1254   BILITOT 0.5 01/11/2024 1406   BILITOT 0.6 08/26/2022 1254       Impression and Plan: Felicia Campos is a very pleasant 54 yo caucasian female with diagnosis of hemochromatosis, heterozygous for the H63D mutation.  Iron studies are pending. We will set her up for phlebotomy of needed.  Follow-up in 4 months.   Lauraine Pepper, NP 1/28/20261:40 PM     [1] No Known Allergies  "

## 2024-12-16 ENCOUNTER — Encounter: Payer: Self-pay | Admitting: Family

## 2024-12-16 ENCOUNTER — Telehealth: Payer: Self-pay

## 2024-12-16 NOTE — Telephone Encounter (Signed)
 This RN called and left message patient stating that her prescription to [facility] was being faxed to the [facility]. Prescription faxed to [phone] and if she needed it faxed to another office to call back.

## 2025-01-11 ENCOUNTER — Encounter: Payer: Self-pay | Admitting: Family Medicine

## 2025-04-12 ENCOUNTER — Inpatient Hospital Stay: Admitting: Family

## 2025-04-12 ENCOUNTER — Inpatient Hospital Stay
